# Patient Record
Sex: Female | Born: 1975 | Hispanic: No | Marital: Single | Smoking: Former smoker
Health system: Southern US, Community
[De-identification: ages and names within clinical notes are randomized; demographics above are authoritative.]

## PROBLEM LIST (undated history)

## (undated) DIAGNOSIS — M5430 Sciatica, unspecified side: Secondary | ICD-10-CM

## (undated) DIAGNOSIS — S83519A Sprain of anterior cruciate ligament of unspecified knee, initial encounter: Secondary | ICD-10-CM

## (undated) DIAGNOSIS — E669 Obesity, unspecified: Secondary | ICD-10-CM

## (undated) DIAGNOSIS — Z9889 Other specified postprocedural states: Secondary | ICD-10-CM

## (undated) DIAGNOSIS — D649 Anemia, unspecified: Secondary | ICD-10-CM

## (undated) DIAGNOSIS — R112 Nausea with vomiting, unspecified: Secondary | ICD-10-CM

## (undated) HISTORY — DX: Obesity, unspecified: E66.9

## (undated) HISTORY — DX: Sciatica, unspecified side: M54.30

## (undated) HISTORY — PX: OTHER SURGICAL HISTORY: SHX169

---

## 2008-08-25 ENCOUNTER — Other Ambulatory Visit: Admission: RE | Admit: 2008-08-25 | Discharge: 2008-08-25 | Payer: Self-pay | Admitting: Obstetrics and Gynecology

## 2010-06-21 ENCOUNTER — Other Ambulatory Visit
Admission: RE | Admit: 2010-06-21 | Discharge: 2010-06-21 | Payer: Self-pay | Source: Home / Self Care | Admitting: Obstetrics and Gynecology

## 2013-01-29 ENCOUNTER — Ambulatory Visit: Payer: Self-pay | Admitting: Internal Medicine

## 2013-01-29 VITALS — BP 110/78 | HR 78 | Temp 98.0°F | Resp 16 | Ht 63.5 in | Wt 254.0 lb

## 2013-01-29 DIAGNOSIS — M5431 Sciatica, right side: Secondary | ICD-10-CM

## 2013-01-29 DIAGNOSIS — M25562 Pain in left knee: Secondary | ICD-10-CM

## 2013-01-29 DIAGNOSIS — M5430 Sciatica, unspecified side: Secondary | ICD-10-CM

## 2013-01-29 DIAGNOSIS — Z6841 Body Mass Index (BMI) 40.0 and over, adult: Secondary | ICD-10-CM

## 2013-01-29 DIAGNOSIS — M25569 Pain in unspecified knee: Secondary | ICD-10-CM

## 2013-01-29 MED ORDER — MELOXICAM 15 MG PO TABS: 15.0000 mg | ORAL_TABLET | Freq: Every day | ORAL | Status: DC

## 2013-01-29 MED ORDER — ASPIRIN-CINNAMEDRINE-CAFFEINE 500-15-32 MG PO TABS: 1.0000 | ORAL_TABLET | Freq: Three times a day (TID) | ORAL | Status: DC | PRN

## 2013-01-29 MED ORDER — PREDNISONE 10 MG PO TABS: 20.0000 mg | ORAL_TABLET | Freq: Every day | ORAL | Status: DC

## 2013-01-29 NOTE — Patient Instructions (Signed)
Sciatica °Sciatica is pain, weakness, numbness, or tingling along the path of the sciatic nerve. The nerve starts in the lower back and runs down the back of each leg. The nerve controls the muscles in the lower leg and in the back of the knee, while also providing sensation to the back of the thigh, lower leg, and the sole of your foot. Sciatica is a symptom of another medical condition. For instance, nerve damage or certain conditions, such as a herniated disk or bone spur on the spine, pinch or put pressure on the sciatic nerve. This causes the pain, weakness, or other sensations normally associated with sciatica. Generally, sciatica only affects one side of the body. °CAUSES  °· Herniated or slipped disc. °· Degenerative disk disease. °· A pain disorder involving the narrow muscle in the buttocks (piriformis syndrome). °· Pelvic injury or fracture. °· Pregnancy. °· Tumor (rare). °SYMPTOMS  °Symptoms can vary from mild to very severe. The symptoms usually travel from the low back to the buttocks and down the back of the leg. Symptoms can include: °· Mild tingling or dull aches in the lower back, leg, or hip. °· Numbness in the back of the calf or sole of the foot. °· Burning sensations in the lower back, leg, or hip. °· Sharp pains in the lower back, leg, or hip. °· Leg weakness. °· Severe back pain inhibiting movement. °These symptoms may get worse with coughing, sneezing, laughing, or prolonged sitting or standing. Also, being overweight may worsen symptoms. °DIAGNOSIS  °Your caregiver will perform a physical exam to look for common symptoms of sciatica. He or she may ask you to do certain movements or activities that would trigger sciatic nerve pain. Other tests may be performed to find the cause of the sciatica. These may include: °· Blood tests. °· X-rays. °· Imaging tests, such as an MRI or CT scan. °TREATMENT  °Treatment is directed at the cause of the sciatic pain. Sometimes, treatment is not necessary  and the pain and discomfort goes away on its own. If treatment is needed, your caregiver may suggest: °· Over-the-counter medicines to relieve pain. °· Prescription medicines, such as anti-inflammatory medicine, muscle relaxants, or narcotics. °· Applying heat or ice to the painful area. °· Steroid injections to lessen pain, irritation, and inflammation around the nerve. °· Reducing activity during periods of pain. °· Exercising and stretching to strengthen your abdomen and improve flexibility of your spine. Your caregiver may suggest losing weight if the extra weight makes the back pain worse. °· Physical therapy. °· Surgery to eliminate what is pressing or pinching the nerve, such as a bone spur or part of a herniated disk. °HOME CARE INSTRUCTIONS  °· Only take over-the-counter or prescription medicines for pain or discomfort as directed by your caregiver. °· Apply ice to the affected area for 20 minutes, 3 4 times a day for the first 48 72 hours. Then try heat in the same way. °· Exercise, stretch, or perform your usual activities if these do not aggravate your pain. °· Attend physical therapy sessions as directed by your caregiver. °· Keep all follow-up appointments as directed by your caregiver. °· Do not wear high heels or shoes that do not provide proper support. °· Check your mattress to see if it is too soft. A firm mattress may lessen your pain and discomfort. °SEEK IMMEDIATE MEDICAL CARE IF:  °· You lose control of your bowel or bladder (incontinence). °· You have increasing weakness in the lower back,   pelvis, buttocks, or legs. °· You have redness or swelling of your back. °· You have a burning sensation when you urinate. °· You have pain that gets worse when you lie down or awakens you at night. °· Your pain is worse than you have experienced in the past. °· Your pain is lasting longer than 4 weeks. °· You are suddenly losing weight without reason. °MAKE SURE YOU: °· Understand these  instructions. °· Will watch your condition. °· Will get help right away if you are not doing well or get worse. °Document Released: 06/18/2001 Document Revised: 12/24/2011 Document Reviewed: 11/03/2011 °ExitCare® Patient Information ©2014 [employer]. ° °

## 2013-01-29 NOTE — Progress Notes (Signed)
  Subjective:    Patient ID: Angelica Flores, female    DOB: [birth year], 37 y.o.   MRN: [id number]  HPI 37 YO female patient comes in today with two complaints.   1.  Left knee pain for the past two weeks. She has limited range of motion compared to her right side. States the pain is in her upper knee. She has difficulty with walking down stairs, getting out of vehicles. She has increased her exercise activity and thinks she might have over done it. No prior injury. No swelling. No give way.   2.  She states that she has nerve pain in both her legs from her glut to her knee. This is most consistent in the right leg. She has recurrent problems every 6-12 months with uncertain precipitators. She has tried Aleve and no relief was found. She did get an Rx for a steroid and it resolved. OTC Pamprin and Midol relieves it. The pain comes and last for days. She denies any injury or precursor to the pains. There is no tingling or loss of sensation. Gait is not affected.   Review of Systems Noncontributory    Objective:   Physical Exam BP 110/78  Pulse 78  Temp(Src) 98 F (36.7 C) (Oral)  Resp 16  Ht 5' 3.5" (1.613 m)  Wt 254 lb (115.214 kg)  BMI 44.28 kg/m2  SpO2 100%  LMP 01/15/2013 Obese No acute distress Left knee has no effusion/there is a good range of motion/patellar ballottement normal/full extension and flexion/Lockman's negative/no laxity to stressors/no crepitus/McM neg  Nontender lumbar and buttock area Straight leg raise normal to 90 Slightly tender along the course of the sciatic nerve External rotation of the hip slightly tender in the sciatic notch Deep tendon reflexes symmetrical No sensory losses       Assessment & Plan:  Problem #1 left knee pain suggesting tendinitis Problem #2 right pain suggesting recurrent sciatica  We'll treat with meloxicam and if fails we'll add prednisone in one week to 10 days Okay to continue over-the-counter medicines for left  knee/x-ray if not better soon Rehabilitation exercises given

## 2013-03-05 ENCOUNTER — Other Ambulatory Visit: Payer: Self-pay | Admitting: Internal Medicine

## 2013-05-12 ENCOUNTER — Telehealth: Payer: Self-pay

## 2013-05-12 NOTE — Telephone Encounter (Signed)
Pt sent over on the fax yesterday a request to go [facility] and was checking to make sure that this was receieved and nothing has been put into referrals she would like an  email at [email or web address] once all has been completed.  Best number [phone]

## 2013-05-12 NOTE — Telephone Encounter (Signed)
Can you refer? You have only seen her once for knee pain. Her BMI is greater than 40

## 2013-05-12 NOTE — Telephone Encounter (Signed)
Yes Patient Active Problem List   Diagnosis Date Noted  . BMI 40.0-44.9, adult 01/29/2013   By all means refer for weight management

## 2013-05-12 NOTE — Telephone Encounter (Signed)
Done patient advised.

## 2013-07-12 ENCOUNTER — Telehealth: Payer: Self-pay

## 2013-07-12 NOTE — Telephone Encounter (Signed)
Pt has designated dr Merla Richesdoolittle has her pcp, pt wants to talk with the doctor about having gastric bypass surgery

## 2013-07-13 ENCOUNTER — Telehealth: Payer: Self-pay

## 2013-07-13 NOTE — Telephone Encounter (Signed)
Patient indicates she is listing you as her primary care with her insurance. You have seen her once for acute knee pain, how do you want to handle this? She is interested in gastric bypass surgery, will need letters of medical necessity and medical clearance.

## 2013-07-13 NOTE — Telephone Encounter (Signed)
Angelica Flores:  Patient wants to see you to discuss and get cleared for bariatric surgery / vsg (vertical sleeve) , she was given your name as a recommendation, she wants you to know she is 5'31/1102" tall, 38 years old and weighs 285 lbs.  Please call her to discuss prior to her making an appt.  Best numbers to call (between 7a.m. - 3:30- [phone] work), cell [phone] after 3:30.

## 2013-07-13 NOTE — Telephone Encounter (Signed)
Patient should come in to discuss this. I have advised her to make appointment.

## 2013-07-13 NOTE — Telephone Encounter (Signed)
She sent me in the mail with this request as well/please tell her that I plan to retire in 2016-it would be best if we scheduled her to see Benny LennertSarah Flores on a Wednesday when we are both at 104 since my appointments are backed up

## 2013-07-13 NOTE — Telephone Encounter (Signed)
Thank you. I have called her to advise. She was transferred to make an appointment with Maralyn SagoSarah.

## 2013-07-23 ENCOUNTER — Other Ambulatory Visit (HOSPITAL_COMMUNITY)
Admission: RE | Admit: 2013-07-23 | Discharge: 2013-07-23 | Disposition: A | Discharge status: Home / Self Care | Payer: 59 | Source: Ambulatory Visit | Attending: Obstetrics and Gynecology | Admitting: Obstetrics and Gynecology

## 2013-07-23 ENCOUNTER — Other Ambulatory Visit: Payer: Self-pay | Admitting: Obstetrics and Gynecology

## 2013-07-23 DIAGNOSIS — Z1151 Encounter for screening for human papillomavirus (HPV): Secondary | ICD-10-CM | POA: Insufficient documentation

## 2013-07-23 DIAGNOSIS — Z01419 Encounter for gynecological examination (general) (routine) without abnormal findings: Secondary | ICD-10-CM | POA: Insufficient documentation

## 2013-07-28 ENCOUNTER — Ambulatory Visit: Payer: Self-pay | Admitting: *Deleted

## 2013-08-05 ENCOUNTER — Ambulatory Visit: Payer: Self-pay | Admitting: *Deleted

## 2013-08-12 ENCOUNTER — Ambulatory Visit (INDEPENDENT_AMBULATORY_CARE_PROVIDER_SITE_OTHER): Payer: 59 | Admitting: Surgery

## 2013-08-12 ENCOUNTER — Encounter (INDEPENDENT_AMBULATORY_CARE_PROVIDER_SITE_OTHER): Payer: Self-pay | Admitting: Surgery

## 2013-08-12 ENCOUNTER — Other Ambulatory Visit (INDEPENDENT_AMBULATORY_CARE_PROVIDER_SITE_OTHER): Payer: Self-pay

## 2013-08-12 NOTE — Progress Notes (Addendum)
Re:   Angelica Flores DOB:   [birth year] MRN:   [id number]  ASSESSMENT AND PLAN: 1.  Morbid obesity  Weight - 282, BMI - 49.27  Per the 1991 NIH Consensus Statement, the patient is a candidate for bariatric surgery.  The patient attended our initial information session and reviewed the types of bariatric surgery.    The patient is interested in the sleeve gastrectomy.  I discussed with the patient the indications and risks of bariatric surgery.  The potential risks of surgery include, but are not limited to, bleeding, infection, leak from the bowel, DVT and PE, open surgery, long term nutrition consequences, and death.  I also discussed that I have not done a sleeve gastrectomy yet, but have assisted on more that 10.  I have been doing bariatric surgery since 2003.  Our group just started doing sleeve gastrectomies in the last 2 years.  The patient understands the importance of compliance and long term follow-up with our group after surgery.  From here we will obtain lab tests, x-rays, nutrition consult, and psych consult. [US abdomen - has gall stones.  DN 09/06/2013]  2.  Knee and thigh pain 3.  Scoliosis and disk disease  Chief Complaint  Patient presents with  . Bariatric Pre-op   REFERRING PHYSICIAN: Gaye AlkenBARNES,ELIZABETH STEWART, MD  HISTORY OF PRESENT ILLNESS: Angelica Flores is a 38 y.o. (DOB: [birth year])  AA  female whose primary care physician is Gaye AlkenBARNES,ELIZABETH STEWART, MD and comes to me today for consideration of weight loss surgery.  The patient first started dieting at age 38. She cut back on sodas, breads, and  sugars and lost 20-30 pounds. She did the same thing when she was age 38. She has tried multiple diets including: HCG, Atkins, [place] diet. She has been to [place] in [place] help in beauty. She tried Dr. Jodi MourningGracios's medicine - Vermox and ?, which worked for a while.  In 2011, she got phentermine through Dr[facility] Theodoro KosGirouard [facility].  She seems to be able  to do a diet for a while to loose 20 or 30 pounds, then she gains it back.  She heard Dr. Andrey CampanileWilson at the information session.  She knows someone who had a RYGB, but not a sleeve gastrectomy.  I mentioned the support group.  She has no history of stomach disease, liver disease,  gall bladder disease, pancreas disease, or colon disease.   No past medical history on file.   No past surgical history on file.    Current Outpatient Prescriptions  Medication Sig Dispense Refill  . Aspirin-Cinnamedrine-Caffeine (MIDOL MAXIMUM STRENGTH) 500-15-32 MG TABS Take 1 tablet by mouth 3 (three) times daily as needed.  90 each  5  . Prenatal Vit-Fe Sulfate-FA (PRENATAL VITAMIN PO) Take by mouth daily.      . meloxicam (MOBIC) 15 MG tablet Take 1 tablet (15 mg total) by mouth daily.  30 tablet  0  . predniSONE (DELTASONE) 10 MG tablet Take 2 tablets (20 mg total) by mouth daily. 3/3/3/2/2/1/1 SIngle daily dose 6 days  12 tablet  0   No current facility-administered medications for this visit.     No Known Allergies  REVIEW OF SYSTEMS: Skin:  No history of rash.  No history of abnormal moles. Infection:  No history of hepatitis or HIV.  No history of MRSA. Neurologic:  No history of stroke.  No history of seizure.  No history of headaches. Cardiac:  No history of hypertension. No  history of heart disease.  No history of prior cardiac catheterization.  No history of seeing a cardiologist. Pulmonary:  Does not smoke cigarettes.  No asthma or bronchitis.  No OSA/CPAP - she was tested in 2006 and was negative.  Epworth score is 6.  Endocrine:  No diabetes. No thyroid disease. Gastrointestinal:  See HPI Urologic:  No history of kidney stones.  No history of bladder infections. Musculoskeletal:  Has scoliosis/disk disease - 2011.  She says that she has sciatica.  She has thigh and knee pain.  Midol and Prednisone (short course) have helped.  Her last Prednisone was last July for one month.  She has seen Dr. Wandra Feinstein for this. Hematologic:  No bleeding disorder.  No history of anemia.  Not anticoagulated. Psycho-social:  The patient is oriented.   The patient has no obvious psychologic or social impairment to understanding our conversation and plan.  SOCIAL and FAMILY HISTORY: Single. 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  She gets her labs free at [facility]. She has one sone - 36 yo.  PHYSICAL EXAM: BP 120/70  Pulse 80  Resp 16  Ht 5' 3.5" (1.613 m)  Wt 282 lb 9.6 oz (128.187 kg)  BMI 49.27 kg/m2  General: Obese AA F who is alert and generally healthy appearing.  HEENT: Normal. Pupils equal. Neck: Supple. No mass.  No thyroid mass. Lymph Nodes:  No supraclavicular or cervical nodes. Lungs: Clear to auscultation and symmetric breath sounds. Heart:  RRR. No murmur or rub. Abdomen: Soft. No mass. No tenderness. No hernia. Normal bowel sounds.  No abdominal scars. Rectal: Not done. Extremities:  Good strength and ROM  in upper and lower extremities. Neurologic:  Grossly intact to motor and sensory function. Psychiatric: Has normal mood and affect. Behavior is normal.   DATA REVIEWED: Notes that she brought with her and Epic notes.  Ovidio Kin, MD,  [facility] [address]  [address]   [place], [place]    [address] Phone:  [phone] FAX:  [phone]

## 2013-08-24 ENCOUNTER — Ambulatory Visit: Payer: Self-pay | Admitting: Dietician

## 2013-08-25 ENCOUNTER — Encounter: Payer: Self-pay | Admitting: Dietician

## 2013-08-25 ENCOUNTER — Encounter: Payer: 59 | Attending: Surgery | Admitting: Dietician

## 2013-08-25 ENCOUNTER — Ambulatory Visit: Payer: Self-pay | Admitting: Physician Assistant

## 2013-08-25 DIAGNOSIS — E669 Obesity, unspecified: Secondary | ICD-10-CM | POA: Insufficient documentation

## 2013-08-25 DIAGNOSIS — Z713 Dietary counseling and surveillance: Secondary | ICD-10-CM | POA: Insufficient documentation

## 2013-08-25 DIAGNOSIS — Z01818 Encounter for other preprocedural examination: Secondary | ICD-10-CM | POA: Insufficient documentation

## 2013-08-25 NOTE — Patient Instructions (Signed)
Start working on [employer] CompaniesPre-Op Goals. Look for Protein Shakes that you like.

## 2013-08-25 NOTE — Progress Notes (Signed)
  Pre-Op Assessment Visit:  Pre-Operative Sleeve Gastrectomy Surgery  Medical Nutrition Therapy:  Appt start time: 1115   End time:  1200.  Patient was seen on 08/25/2013 for Pre-Operative Sleeve Gastrectomy Nutrition Assessment. Assessment and [facility] on 08/25/2013.   Preferred Learning Style:   No preference indicated   Learning Readiness:   Ready  Handouts given during visit include:  Pre-Op Goals Bariatric Surgery Protein Shakes  Teaching Method Utilized:  Visual Auditory Hands on  Barriers to learning/adherence to lifestyle change: none  Demonstrated degree of understanding via:  Teach Back   Patient to [facility] to enroll in Pre-Op and Post-Op Nutrition Education when surgery date is scheduled.

## 2013-09-02 ENCOUNTER — Encounter (HOSPITAL_COMMUNITY)
Admission: RE | Disposition: A | Discharge status: Home / Self Care | Payer: Self-pay | Source: Ambulatory Visit | Attending: Surgery

## 2013-09-02 ENCOUNTER — Ambulatory Visit (HOSPITAL_COMMUNITY)
Admission: RE | Admit: 2013-09-02 | Discharge: 2013-09-02 | Disposition: A | Discharge status: Home / Self Care | Payer: 59 | Source: Ambulatory Visit | Attending: Surgery | Admitting: Surgery

## 2013-09-02 HISTORY — PX: BREATH TEK H PYLORI: SHX5422

## 2013-09-02 SURGERY — BREATH TEST, FOR HELICOBACTER PYLORI

## 2013-09-03 ENCOUNTER — Encounter (HOSPITAL_COMMUNITY): Payer: Self-pay | Admitting: Surgery

## 2013-09-03 ENCOUNTER — Ambulatory Visit (HOSPITAL_COMMUNITY)
Admission: RE | Admit: 2013-09-03 | Discharge: 2013-09-03 | Disposition: A | Discharge status: Home / Self Care | Payer: 59 | Source: Ambulatory Visit | Attending: Surgery | Admitting: Surgery

## 2013-09-03 ENCOUNTER — Other Ambulatory Visit: Payer: Self-pay

## 2013-09-03 DIAGNOSIS — M25569 Pain in unspecified knee: Secondary | ICD-10-CM | POA: Insufficient documentation

## 2013-09-03 DIAGNOSIS — Z6841 Body Mass Index (BMI) 40.0 and over, adult: Secondary | ICD-10-CM | POA: Insufficient documentation

## 2013-09-03 DIAGNOSIS — K8020 Calculus of gallbladder without cholecystitis without obstruction: Secondary | ICD-10-CM | POA: Insufficient documentation

## 2013-09-03 DIAGNOSIS — M79609 Pain in unspecified limb: Secondary | ICD-10-CM | POA: Insufficient documentation

## 2013-09-03 DIAGNOSIS — M4120 Other idiopathic scoliosis, site unspecified: Secondary | ICD-10-CM | POA: Insufficient documentation

## 2013-09-23 ENCOUNTER — Encounter: Payer: 59 | Attending: Surgery | Admitting: Dietician

## 2013-09-23 DIAGNOSIS — Z01818 Encounter for other preprocedural examination: Secondary | ICD-10-CM | POA: Insufficient documentation

## 2013-09-23 DIAGNOSIS — Z713 Dietary counseling and surveillance: Secondary | ICD-10-CM | POA: Insufficient documentation

## 2013-09-23 DIAGNOSIS — E669 Obesity, unspecified: Secondary | ICD-10-CM | POA: Insufficient documentation

## 2013-09-23 NOTE — Patient Instructions (Signed)
-  Maintain high vegetable intake -Follow MyPlate guidelines -Increase physical activity

## 2013-09-23 NOTE — Progress Notes (Signed)
  Medical Nutrition Therapy:  Appt start time: 1500 end time:  1515.  Assessment:  Primary concerns today: SWL visit #1 out of 6. Trying to increase vegetable intake by making shakes with kale. Increased veggie intake by 2 servings overall. Working a lot lately and not preparing food ahead of time like she wants to. Has supportive family and co-worker/lunch buddy is supportive and has similar weight loss goals.   MEDICATIONS: prenatal vitamins  Recent physical activity: none lately; plans to start Zumba with DVDs at home  Estimated energy needs: 1800-2000 calories   Progress Towards Goal(s):  In progress.   Nutritional Diagnosis:  [place]-3.3 Overweight/obesity related to poor dietary habits and physical inactivity as evidenced by BMI 51.     Intervention:  Nutrition counseling provided.  Handouts given during visit include:  MyPlate  Monitoring/Evaluation:  Dietary intake, exercise, and body weight in 4 week(s).

## 2013-10-14 ENCOUNTER — Other Ambulatory Visit (INDEPENDENT_AMBULATORY_CARE_PROVIDER_SITE_OTHER): Payer: Self-pay

## 2013-10-25 ENCOUNTER — Encounter: Payer: 59 | Attending: Surgery | Admitting: Dietician

## 2013-10-25 DIAGNOSIS — E669 Obesity, unspecified: Secondary | ICD-10-CM | POA: Insufficient documentation

## 2013-10-25 DIAGNOSIS — Z713 Dietary counseling and surveillance: Secondary | ICD-10-CM | POA: Insufficient documentation

## 2013-10-25 DIAGNOSIS — Z01818 Encounter for other preprocedural examination: Secondary | ICD-10-CM | POA: Insufficient documentation

## 2013-10-25 NOTE — Progress Notes (Signed)
  Medical Nutrition Therapy:  Appt start time: 1430 end time:  1445.  Assessment:  Primary concerns today: Angelica Flores is here for her SWL visit #2 out of 6. She has lost 11 pounds since her last visit. She has been increasing her vegetable intake by making fruit shakes with kale or spinach and sipping them all day. She has been better about preparing food ahead of time like she wants to. Has supportive family and co-worker/lunch buddy is supportive and has similar weight loss goals.   MEDICATIONS: prenatal vitamins  Recent physical activity: walking 3 miles 2 days per week  Estimated energy needs: 1800-2000 calories   Progress Towards Goal(s):  In progress.   Nutritional Diagnosis:  [place]-3.3 Overweight/obesity related to poor dietary habits and physical inactivity as evidenced by BMI 49.     Intervention:  Nutrition counseling provided.  Handouts given during visit include:  MyPlate  Monitoring/Evaluation:  Dietary intake, exercise, and body weight in 4 week(s).

## 2013-10-25 NOTE — Patient Instructions (Addendum)
-  Maintain high vegetable intake -Follow MyPlate guidelines -Increase physical activity as tolerated

## 2013-11-25 ENCOUNTER — Encounter: Payer: 59 | Attending: Surgery | Admitting: Dietician

## 2013-11-25 DIAGNOSIS — E669 Obesity, unspecified: Secondary | ICD-10-CM | POA: Insufficient documentation

## 2013-11-25 DIAGNOSIS — Z713 Dietary counseling and surveillance: Secondary | ICD-10-CM | POA: Insufficient documentation

## 2013-11-25 DIAGNOSIS — Z01818 Encounter for other preprocedural examination: Secondary | ICD-10-CM | POA: Insufficient documentation

## 2013-11-25 NOTE — Patient Instructions (Addendum)
*  Maintain current goals!  -Maintain high vegetable intake -Follow MyPlate guidelines -Increase physical activity as tolerated

## 2013-11-25 NOTE — Progress Notes (Signed)
  Medical Nutrition Therapy:  Appt start time: 1700 end time:  1715.  Assessment:  Primary concerns today:  SWL visit #3. Angelica Flores has lost 8 pounds since last visit. Increased exercise, more during the week. Walking 2.5-3 miles at a time (6 miles on the weekends). "Splurges feel different now." She has been increasing her vegetable intake by making fruit shakes with kale or spinach and sipping them all day. She has been better about preparing food ahead of time like she wants to. Has supportive family and co-worker/lunch buddy is supportive and has similar weight loss goals.   MEDICATIONS: prenatal vitamins  Recent physical activity: walking 2.5-3 miles 2-3 days per week  Estimated energy needs: 1800-2000 calories   Progress Towards Goal(s):  In progress.   Nutritional Diagnosis:  [place]-3.3 Overweight/obesity related to poor dietary habits and physical inactivity as evidenced by BMI 49.     Intervention:  Nutrition counseling provided.   Monitoring/Evaluation:  Dietary intake, exercise, and body weight in 4 week(s).

## 2013-12-23 ENCOUNTER — Encounter: Payer: 59 | Attending: Surgery | Admitting: Dietician

## 2013-12-23 DIAGNOSIS — Z713 Dietary counseling and surveillance: Secondary | ICD-10-CM | POA: Insufficient documentation

## 2013-12-23 DIAGNOSIS — Z01818 Encounter for other preprocedural examination: Secondary | ICD-10-CM | POA: Insufficient documentation

## 2013-12-23 DIAGNOSIS — E669 Obesity, unspecified: Secondary | ICD-10-CM | POA: Insufficient documentation

## 2013-12-23 NOTE — Patient Instructions (Addendum)
-  Increase vegetable intake -Follow MyPlate guidelines -Increase water intake  -Increase physical activity as tolerated

## 2013-12-23 NOTE — Progress Notes (Signed)
  Medical Nutrition Therapy:  Appt start time: 415 end time:  430.  Follow-up:  Primary concerns today:  SWL visit #4.   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 She reports not walking as much because of the heat. She is also not having as many vegetables. She states that her goal is to lose 10 pounds by her next visit. We discussed a healthy weight loss rate of 1-2 pounds per week. Although the patient is scheduled to be approved for her surgery at the end of the summer, she plans to have surgery in winter due to finances.   MEDICATIONS: prenatal vitamins  Recent physical activity: no walking in past week or 2  Estimated energy needs: 1800-2000 calories   Progress Towards Goal(s):  In progress.   Nutritional Diagnosis:  [place]-3.3 Overweight/obesity related to poor dietary habits and physical inactivity as evidenced by BMI 49.     Intervention:  Nutrition counseling provided.   Monitoring/Evaluation:  Dietary intake, exercise, and body weight in 4 week(s).

## 2014-01-24 ENCOUNTER — Encounter: Payer: 59 | Attending: Surgery | Admitting: Dietician

## 2014-01-24 DIAGNOSIS — Z01818 Encounter for other preprocedural examination: Secondary | ICD-10-CM | POA: Diagnosis present

## 2014-01-24 DIAGNOSIS — Z713 Dietary counseling and surveillance: Secondary | ICD-10-CM | POA: Diagnosis not present

## 2014-01-24 DIAGNOSIS — E669 Obesity, unspecified: Secondary | ICD-10-CM | POA: Diagnosis not present

## 2014-01-24 NOTE — Progress Notes (Signed)
  Medical Nutrition Therapy:  Appt start time: 515 end time:  530.  Follow-up:  Primary concerns today:  SWL visit #5.   Angelica Flores returns today having lost 8 pounds since last visit. She reports that she has started exercising more: walking 1-2x a week. She reports that she hopes to lose another 8 pounds before next SWL visit. Angelica Flores states that she has increased vegetable intake. Work schedule has been hectic. Has increased water intake but states that she still "has a long way to go." Working on chewing and portion sizes in preparation for gastric sleeve.   MEDICATIONS: prenatal vitamins  Recent physical activity: walking 1-2x a week  Estimated energy needs: 1800-2000 calories   Progress Towards Goal(s):  In progress.   Nutritional Diagnosis:  [place]-3.3 Overweight/obesity related to poor dietary habits and physical inactivity as evidenced by BMI 49.     Intervention:  Nutrition counseling provided.   Monitoring/Evaluation:  Dietary intake, exercise, and body weight in 4 week(s).

## 2014-01-24 NOTE — Patient Instructions (Addendum)
-  Increase water intake -Increase physical activity as tolerated

## 2014-02-24 ENCOUNTER — Ambulatory Visit: Payer: 59 | Admitting: Dietician

## 2014-03-07 ENCOUNTER — Encounter: Payer: 59 | Attending: Surgery | Admitting: Dietician

## 2014-03-07 DIAGNOSIS — Z01818 Encounter for other preprocedural examination: Secondary | ICD-10-CM | POA: Diagnosis not present

## 2014-03-07 DIAGNOSIS — E669 Obesity, unspecified: Secondary | ICD-10-CM | POA: Diagnosis not present

## 2014-03-07 DIAGNOSIS — Z713 Dietary counseling and surveillance: Secondary | ICD-10-CM | POA: Insufficient documentation

## 2014-03-07 NOTE — Patient Instructions (Signed)
-  Increase physical activity as tolerated -Drink mostly water or other zero calorie beverage. -If you want some juice, add more vegetables and have 1-2 serving of fruit (1-2 cups). -Keep working on Pre-Op goals - chewing well, not drinking while you are eating. -Try some protein shakes. -Talk to [place] about scheduling surgery.  Once surgery is scheduled, contact [facility] to enroll in Pre-Op Class.

## 2014-03-07 NOTE — Progress Notes (Signed)
  Medical Nutrition Therapy:  Appt start time: 345 end time:  400.  Follow-up:  Primary concerns today:  SWL visit #6.   Angelica Flores returns today having gained about 7 lbs since last visit. Has not been able to do her exercise as much as before. Has been working more than before. Overall lost about 19 lbs from 09/2013 until today.    Hasn't made too many changes to her diet those has had some burgers in the past month. Still working on chewing well. Drinking water and has 64 oz fruit juice per day (whole pineapple and 6 apples) sometimes with vegetables.   MEDICATIONS: prenatal vitamins  Recent physical activity: walking 1-2x a week  Estimated energy needs: 1800-2000 calories   Progress Towards Goal(s):  In progress.   Nutritional Diagnosis:  [place]-3.3 Overweight/obesity related to poor dietary habits and physical inactivity as evidenced by BMI 49.     Intervention:  Nutrition counseling provided.   Monitoring/Evaluation:  Dietary intake, exercise, and body weight and return for Pre-Op Class.

## 2014-06-20 ENCOUNTER — Encounter: Payer: 59 | Attending: Surgery

## 2014-06-20 DIAGNOSIS — Z713 Dietary counseling and surveillance: Secondary | ICD-10-CM | POA: Diagnosis not present

## 2014-06-20 DIAGNOSIS — Z6841 Body Mass Index (BMI) 40.0 and over, adult: Secondary | ICD-10-CM | POA: Insufficient documentation

## 2014-06-21 NOTE — Progress Notes (Signed)
  Pre-Operative Nutrition Class:  Appt start time: 1898   End time:  1830.  Patient was seen on 06/20/14 for Pre-Operative Bariatric Surgery Education at the [facility].   Surgery date: 07/05/14 Surgery type: gastric sleeve Start weight at [facility]: 286 lbs on 08/25/13 Weight today: 274.5 lbs  TANITA  BODY COMP RESULTS  06/20/14   BMI (kg/m^2) 47.9   Fat Mass (lbs) 141.5   Fat Free Mass (lbs) 133   Total Body Water (lbs) 97.5   Samples given per MNT protocol. Patient educated on appropriate usage: Premier protein shake (chocolate - qty 1) Lot #: [id number] Exp: 03/2015  The following the learning objectives were met by the patient during this course:  Identify Pre-Op Dietary Goals and will begin 2 weeks pre-operatively  Identify appropriate sources of fluids and proteins   State protein recommendations and appropriate sources pre and post-operatively  Identify Post-Operative Dietary Goals and will follow for 2 weeks post-operatively  Identify appropriate multivitamin and calcium sources  Describe the need for physical activity post-operatively and will follow MD recommendations  State when to call healthcare provider regarding medication questions or post-operative complications  Handouts given during class include:  Pre-Op Bariatric Surgery Diet Handout  Protein Shake Handout  Post-Op Bariatric Surgery Nutrition Handout  BELT Program Information Flyer  Support Group Information Flyer  [facility] Bariatric Supplements Price List  Follow-Up Plan: Patient will follow-up at [facility] 2 weeks post operatively for diet advancement per MD.

## 2014-06-23 ENCOUNTER — Ambulatory Visit (INDEPENDENT_AMBULATORY_CARE_PROVIDER_SITE_OTHER): Payer: Self-pay | Admitting: Surgery

## 2014-06-23 NOTE — H&P (Signed)
Chief Complaint:  Morbid obesity BMI 47.6 and a weight of 274.  History of Present Illness:  Angelica Flores is an 38 y.o. female initially seen earlier this year by Dr. Ezzard StandingNewman and she is a [employer] healthcare patient who has qualified for laparoscopic sleeve gastrectomy.  I have seen and examined her preoperatively and have answered all of her questions regarding sleeve gastrectomy.  She has done a thorough job researching this including watching online videos of the procedure itself and I described how I do the procedure in detail.  She is aware of the risk of bleeding and leak.  Her mother accompanies her on this visit and appears to be a very supportive of her decision to undergo sleeve gastrectomy.  Her past medical history is remarkable in that she did have some ureteral lengthening procedure as a child at the [facility] when she was 38 years old.  She said the only allergic problem she had with penicillin which she got sick and vomited.  I indicated this may not be a true allergies.  She says she's had problem with her left knee and may have a problem with her cruciate ligaments.  She denies any history of DVT.  We'll proceed with laparoscopic sleeve gastrectomy.  Past Medical History  Diagnosis Date  . Obesity   . Sciatic pain     Past Surgical History  Procedure Laterality Date  . Vur    . Breath tek h pylori N/A 09/02/2013    Procedure: BREATH TEK H PYLORI;  Surgeon: Kandis Cockingavid H Newman, MD;  [facility];  Service: General;  Laterality: N/A;    Current Outpatient Prescriptions  Medication Sig Dispense Refill  . Aspirin-Cinnamedrine-Caffeine (MIDOL MAXIMUM STRENGTH) 500-15-32 MG TABS Take 1 tablet by mouth 3 (three) times daily as needed. 90 each 5  . meloxicam (MOBIC) 15 MG tablet Take 1 tablet (15 mg total) by mouth daily. 30 tablet 0  . MONONESSA 0.25-35 MG-MCG tablet     . Prenatal Vit-Fe Sulfate-FA (PRENATAL VITAMIN PO) Take by mouth daily.     No current  facility-administered medications for this visit.   Review of patient's allergies indicates no known allergies. Family History  Problem Relation Age of Onset  . Hypertension Other   . Obesity Other    Social History:   reports that she has been smoking Cigarettes.  She has been smoking about 0.50 packs per day. She does not have any smokeless tobacco history on file. She reports that she does not drink alcohol or use illicit drugs.   REVIEW OF SYSTEMS : Negative except for prior urologic surgery as a child.  Physical Exam:   There were no vitals taken for this visit. There is no weight on file to calculate BMI.  Gen:  WDWN African-American female NAD  Neurological: Alert and oriented to person, place, and time. Motor and sensory function is grossly intact  Head: Normocephalic and atraumatic.  Eyes: Conjunctivae are normal. Pupils are equal, round, and reactive to light. No scleral icterus.  Neck: Normal range of motion. Neck supple. No tracheal deviation or thyromegaly present.  Cardiovascular:  SR without murmurs or gallops.  No carotid bruits Breast:  Not examined Respiratory: Effort normal.  No respiratory distress. No chest wall tenderness. Breath sounds normal.  No wheezes, rales or rhonchi.  Abdomen:  Obese nontender GU:  Not examined  Musculoskeletal: Normal range of motion. Extremities are nontender. No cyanosis, edema or clubbing noted Lymphadenopathy: No cervical, preauricular, postauricular or  axillary adenopathy is present Skin: Skin is warm and dry. No rash noted. No diaphoresis. No erythema. No pallor. Pscyh: Normal mood and affect. Behavior is normal. Judgment and thought content normal.   LABORATORY RESULTS: No results found for this or any previous visit (from the past 48 hour(s)).   RADIOLOGY RESULTS: No results found.  Problem List: Patient Active Problem List   Diagnosis Date Noted  . Morbid obesity 08/12/2013    Assessment & Plan: Morbid obesity and  desire for sleeve gastrectomy.  Plan December 29 sleeve gastrectomy.    Matt B. Ellery Tash, MD, FACS  [facility] [phone] beeper [phone]  06/23/2014 11:58 AM      

## 2014-06-27 NOTE — Patient Instructions (Addendum)
Angelica Flores  06/27/2014   Your procedure is scheduled on: 07/05/2014                Come either thru Emergency Room Entrance or Main entrance at 0515am and [facility].               Call this number if you have problems the morning of surgery [phone]   Remember:  Do not eat food or drink liquids :After Midnight.     Take these medicines the morning of surgery with A SIP OF WATER: none                               You may not have any metal on your body including hair pins and              piercings  Do not wear jewelry, make-up, lotions, powders or perfumes.             Do not wear nail polish.  Do not shave  48 hours prior to surgery.           Do not bring valuables to the hospital. [facility] IS NOT             RESPONSIBLE   FOR VALUABLES.  Contacts, dentures or bridgework may not be worn into surgery.  Leave suitcase in the car. After surgery it may be brought to your room.       Special Instructions: coughing and deep breathing exercises, leg exercises              Please read over the following fact sheets you were given: _____________________________________________________________________             [facility] - Preparing for [facility] Before surgery, you can play an important role.  Because skin is not sterile, your skin needs to be as free of germs as possible.  You can reduce the number of germs on your skin by washing with CHG (chlorahexidine gluconate) soap before surgery.  CHG is an antiseptic cleaner which kills germs and bonds with the skin to continue killing germs even after washing. Please DO NOT use if you have an allergy to CHG or antibacterial soaps.  If your skin becomes reddened/irritated stop using the CHG and inform your nurse when you arrive at Short Stay. Do not shave (including legs and underarms) for at least 48 hours prior to the first CHG shower.  You may shave your face/neck. Please follow  these instructions carefully:  1.  Shower with CHG Soap the night before surgery and the  morning of Surgery.  2.  If you choose to wash your hair, wash your hair first as usual with your  normal  shampoo.  3.  After you shampoo, rinse your hair and body thoroughly to remove the  shampoo.                           4.  Use CHG as you would any other liquid soap.  You can apply chg directly  to the skin and wash                       Gently with a scrungie or clean washcloth.  5.  Apply the CHG  Soap to your body ONLY FROM THE NECK DOWN.   Do not use on face/ open                           Wound or open sores. Avoid contact with eyes, ears mouth and genitals (private parts).                       Wash face,  Genitals (private parts) with your normal soap.             6.  Wash thoroughly, paying special attention to the area where your surgery  will be performed.  7.  Thoroughly rinse your body with warm water from the neck down.  8.  DO NOT shower/wash with your normal soap after using and rinsing off  the CHG Soap.                9.  Pat yourself dry with a clean towel.            10.  Wear clean pajamas.            11.  Place clean sheets on your bed the night of your first shower and do not  sleep with pets. Day of Surgery : Do not apply any lotions/deodorants the morning of surgery.  Please wear clean clothes to the hospital/surgery center.  FAILURE TO FOLLOW THESE INSTRUCTIONS MAY RESULT IN THE CANCELLATION OF YOUR SURGERY PATIENT SIGNATURE_________________________________  NURSE SIGNATURE__________________________________  ________________________________________________________________________

## 2014-06-29 ENCOUNTER — Encounter (HOSPITAL_COMMUNITY)
Admission: RE | Admit: 2014-06-29 | Discharge: 2014-06-29 | Disposition: A | Discharge status: Home / Self Care | Payer: 59 | Source: Ambulatory Visit | Attending: Surgery | Admitting: Surgery

## 2014-06-29 ENCOUNTER — Encounter (HOSPITAL_COMMUNITY): Payer: Self-pay

## 2014-06-29 DIAGNOSIS — Z01812 Encounter for preprocedural laboratory examination: Secondary | ICD-10-CM | POA: Diagnosis present

## 2014-06-29 HISTORY — DX: Sprain of anterior cruciate ligament of unspecified knee, initial encounter: S83.519A

## 2014-06-29 HISTORY — DX: Other specified postprocedural states: Z98.890

## 2014-06-29 HISTORY — DX: Anemia, unspecified: D64.9

## 2014-06-29 HISTORY — DX: Nausea with vomiting, unspecified: R11.2

## 2014-06-29 LAB — COMPREHENSIVE METABOLIC PANEL
ALBUMIN: 3.5 g/dL (ref 3.5–5.2)
ALT: 17 U/L (ref 0–35)
AST: 19 U/L (ref 0–37)
Alkaline Phosphatase: 103 U/L (ref 39–117)
Anion gap: 7 (ref 5–15)
BUN: 16 mg/dL (ref 6–23)
CO2: 27 mmol/L (ref 19–32)
Calcium: 8.9 mg/dL (ref 8.4–10.5)
Chloride: 102 mEq/L (ref 96–112)
Creatinine, Ser: 0.68 mg/dL (ref 0.50–1.10)
GFR calc Af Amer: >90 mL/min (ref >90)
GFR calc non Af Amer: >90 mL/min (ref >90)
Glucose, Bld: 86 mg/dL (ref 70–99)
POTASSIUM: 4.1 mmol/L (ref 3.5–5.1)
Sodium: 136 mmol/L (ref 135–145)
Total Bilirubin: 0.5 mg/dL (ref 0.3–1.2)
Total Protein: 7.5 g/dL (ref 6.0–8.3)

## 2014-06-29 LAB — CBC WITH DIFFERENTIAL/PLATELET
BASOS PCT: 0 % (ref 0–1)
Basophils Absolute: 0 10*3/uL (ref 0.0–0.1)
Eosinophils Absolute: 0.4 10*3/uL (ref 0.0–0.7)
Eosinophils Relative: 6 % — ABNORMAL HIGH (ref 0–5)
HCT: 36.6 % (ref 36.0–46.0)
HEMOGLOBIN: 11.8 g/dL — AB (ref 12.0–15.0)
LYMPHS PCT: 37 % (ref 12–46)
Lymphs Abs: 2.6 10*3/uL (ref 0.7–4.0)
MCH: 25 pg — ABNORMAL LOW (ref 26.0–34.0)
MCHC: 32.2 g/dL (ref 30.0–36.0)
MCV: 77.5 fL — ABNORMAL LOW (ref 78.0–100.0)
MONOS PCT: 9 % (ref 3–12)
Monocytes Absolute: 0.6 10*3/uL (ref 0.1–1.0)
NEUTROS ABS: 3.4 10*3/uL (ref 1.7–7.7)
NEUTROS PCT: 48 % (ref 43–77)
Platelets: 264 10*3/uL (ref 150–400)
RBC: 4.72 MIL/uL (ref 3.87–5.11)
RDW: 15.8 % — ABNORMAL HIGH (ref 11.5–15.5)
WBC: 7.1 10*3/uL (ref 4.0–10.5)

## 2014-07-05 ENCOUNTER — Encounter (HOSPITAL_COMMUNITY): Payer: Self-pay | Admitting: *Deleted

## 2014-07-05 ENCOUNTER — Encounter (HOSPITAL_COMMUNITY)
Admission: RE | Disposition: A | Discharge status: Home / Self Care | Payer: Self-pay | Source: Ambulatory Visit | Attending: Surgery

## 2014-07-05 ENCOUNTER — Ambulatory Visit (HOSPITAL_COMMUNITY): Payer: 59 | Admitting: Anesthesiology

## 2014-07-05 ENCOUNTER — Inpatient Hospital Stay (HOSPITAL_COMMUNITY)
Admission: RE | Admit: 2014-07-05 | Discharge: 2014-07-07 | DRG: 621 | Disposition: A | Discharge status: Home / Self Care | Payer: 59 | Source: Ambulatory Visit | Attending: Surgery | Admitting: Surgery

## 2014-07-05 DIAGNOSIS — Z9884 Bariatric surgery status: Secondary | ICD-10-CM

## 2014-07-05 DIAGNOSIS — Z01812 Encounter for preprocedural laboratory examination: Secondary | ICD-10-CM

## 2014-07-05 DIAGNOSIS — Z6841 Body Mass Index (BMI) 40.0 and over, adult: Secondary | ICD-10-CM | POA: Diagnosis not present

## 2014-07-05 DIAGNOSIS — F17210 Nicotine dependence, cigarettes, uncomplicated: Secondary | ICD-10-CM | POA: Diagnosis present

## 2014-07-05 HISTORY — PX: UPPER GI ENDOSCOPY: SHX6162

## 2014-07-05 HISTORY — PX: LAPAROSCOPIC GASTRIC SLEEVE RESECTION: SHX5895

## 2014-07-05 LAB — CBC
HEMATOCRIT: 36.2 % (ref 36.0–46.0)
HEMOGLOBIN: 11.4 g/dL — AB (ref 12.0–15.0)
MCH: 24.7 pg — ABNORMAL LOW (ref 26.0–34.0)
MCHC: 31.5 g/dL (ref 30.0–36.0)
MCV: 78.5 fL (ref 78.0–100.0)
Platelets: 234 10*3/uL (ref 150–400)
RBC: 4.61 MIL/uL (ref 3.87–5.11)
RDW: 16.1 % — ABNORMAL HIGH (ref 11.5–15.5)
WBC: 9.9 10*3/uL (ref 4.0–10.5)

## 2014-07-05 LAB — CREATININE, SERUM
Creatinine, Ser: 0.83 mg/dL (ref 0.50–1.10)
GFR, EST NON AFRICAN AMERICAN: 90 mL/min — AB (ref >90)

## 2014-07-05 LAB — PREGNANCY, URINE: Preg Test, Ur: NEGATIVE

## 2014-07-05 LAB — HEMOGLOBIN AND HEMATOCRIT, BLOOD
HEMATOCRIT: 36.2 % (ref 36.0–46.0)
Hemoglobin: 11.4 g/dL — ABNORMAL LOW (ref 12.0–15.0)

## 2014-07-05 SURGERY — GASTRECTOMY, SLEEVE, LAPAROSCOPIC
Anesthesia: General

## 2014-07-05 MED ORDER — BUPIVACAINE LIPOSOME 1.3 % IJ SUSP
INTRAMUSCULAR | Status: DC | PRN
  Administered 2014-07-05: 20 mL

## 2014-07-05 MED ORDER — LIDOCAINE HCL (CARDIAC) 20 MG/ML IV SOLN
INTRAVENOUS | Status: AC
  Filled 2014-07-05: qty 5

## 2014-07-05 MED ORDER — DEXTROSE 5 % IV SOLN
2.0000 g | INTRAVENOUS | Status: AC
  Administered 2014-07-05: 2 g via INTRAVENOUS

## 2014-07-05 MED ORDER — OXYCODONE HCL 5 MG/5ML PO SOLN: 5.0000 mg | ORAL | Status: DC | PRN

## 2014-07-05 MED ORDER — FENTANYL CITRATE 0.05 MG/ML IJ SOLN
INTRAMUSCULAR | Status: DC | PRN
  Administered 2014-07-05 (×5): 50 ug via INTRAVENOUS
  Administered 2014-07-05: 25 ug via INTRAVENOUS
  Administered 2014-07-05: 100 ug via INTRAVENOUS
  Administered 2014-07-05 (×2): 50 ug via INTRAVENOUS
  Administered 2014-07-05: 25 ug via INTRAVENOUS

## 2014-07-05 MED ORDER — HEPARIN SODIUM (PORCINE) 5000 UNIT/ML IJ SOLN
5000.0000 [IU] | Freq: Three times a day (TID) | INTRAMUSCULAR | Status: DC
Start: 2014-07-05 — End: 2014-07-07
  Administered 2014-07-05 – 2014-07-07 (×5): 5000 [IU] via SUBCUTANEOUS
  Filled 2014-07-05 (×8): qty 1

## 2014-07-05 MED ORDER — UNJURY CHOCOLATE CLASSIC POWDER: 2.0000 [oz_av] | Freq: Four times a day (QID) | ORAL | Status: DC

## 2014-07-05 MED ORDER — 0.9 % SODIUM CHLORIDE (POUR BTL) OPTIME
TOPICAL | Status: DC | PRN
  Administered 2014-07-05: 1000 mL

## 2014-07-05 MED ORDER — HEPARIN SODIUM (PORCINE) 5000 UNIT/ML IJ SOLN
5000.0000 [IU] | INTRAMUSCULAR | Status: AC
  Administered 2014-07-05: 5000 [IU] via SUBCUTANEOUS
  Filled 2014-07-05: qty 1

## 2014-07-05 MED ORDER — METOCLOPRAMIDE HCL 5 MG/ML IJ SOLN
INTRAMUSCULAR | Status: DC | PRN
  Administered 2014-07-05: 10 mg via INTRAVENOUS

## 2014-07-05 MED ORDER — NEOSTIGMINE METHYLSULFATE 10 MG/10ML IV SOLN
INTRAVENOUS | Status: DC | PRN
  Administered 2014-07-05: 5 mg via INTRAVENOUS

## 2014-07-05 MED ORDER — HYDROMORPHONE HCL 1 MG/ML IJ SOLN
0.2500 mg | INTRAMUSCULAR | Status: DC | PRN
  Administered 2014-07-05 (×4): 0.5 mg via INTRAVENOUS

## 2014-07-05 MED ORDER — ROCURONIUM BROMIDE 100 MG/10ML IV SOLN
INTRAVENOUS | Status: DC | PRN
  Administered 2014-07-05: 25 mg via INTRAVENOUS
  Administered 2014-07-05: 15 mg via INTRAVENOUS
  Administered 2014-07-05: 40 mg via INTRAVENOUS

## 2014-07-05 MED ORDER — DEXAMETHASONE SODIUM PHOSPHATE 10 MG/ML IJ SOLN
INTRAMUSCULAR | Status: DC | PRN
  Administered 2014-07-05: 5 mg via INTRAVENOUS

## 2014-07-05 MED ORDER — ACETAMINOPHEN 160 MG/5ML PO SOLN: 325.0000 mg | ORAL | Status: DC | PRN

## 2014-07-05 MED ORDER — SODIUM CHLORIDE 0.9 % IJ SOLN
INTRAMUSCULAR | Status: DC | PRN
  Administered 2014-07-05: 10 mL via INTRAVENOUS

## 2014-07-05 MED ORDER — ONDANSETRON HCL 4 MG/2ML IJ SOLN
4.0000 mg | INTRAMUSCULAR | Status: DC | PRN
  Administered 2014-07-05 – 2014-07-06 (×5): 4 mg via INTRAVENOUS
  Filled 2014-07-05 (×5): qty 2

## 2014-07-05 MED ORDER — LIDOCAINE HCL (CARDIAC) 20 MG/ML IV SOLN
INTRAVENOUS | Status: DC | PRN
  Administered 2014-07-05: 50 mg via INTRAVENOUS

## 2014-07-05 MED ORDER — GLYCOPYRROLATE 0.2 MG/ML IJ SOLN
INTRAMUSCULAR | Status: DC | PRN
  Administered 2014-07-05: .8 mg via INTRAVENOUS

## 2014-07-05 MED ORDER — LACTATED RINGERS IV SOLN: INTRAVENOUS | Status: DC

## 2014-07-05 MED ORDER — MORPHINE SULFATE 2 MG/ML IJ SOLN
2.0000 mg | INTRAMUSCULAR | Status: DC | PRN
  Administered 2014-07-05 (×2): 4 mg via INTRAVENOUS
  Administered 2014-07-06: 2 mg via INTRAVENOUS
  Administered 2014-07-06: 4 mg via INTRAVENOUS
  Administered 2014-07-06: 2 mg via INTRAVENOUS
  Filled 2014-07-05 (×2): qty 2
  Filled 2014-07-05: qty 1
  Filled 2014-07-05: qty 2
  Filled 2014-07-05: qty 1

## 2014-07-05 MED ORDER — KCL IN DEXTROSE-NACL 20-5-0.45 MEQ/L-%-% IV SOLN
INTRAVENOUS | Status: DC
  Administered 2014-07-05: 15:00:00 via INTRAVENOUS
  Administered 2014-07-06: 1000 mL via INTRAVENOUS
  Administered 2014-07-07: 100 mL via INTRAVENOUS
  Filled 2014-07-05 (×7): qty 1000

## 2014-07-05 MED ORDER — ONDANSETRON HCL 4 MG/2ML IJ SOLN
INTRAMUSCULAR | Status: AC
  Filled 2014-07-05: qty 2

## 2014-07-05 MED ORDER — PROPOFOL 10 MG/ML IV BOLUS
INTRAVENOUS | Status: AC
Start: 2014-07-05 — End: 2014-07-05
  Filled 2014-07-05: qty 20

## 2014-07-05 MED ORDER — MIDAZOLAM HCL 5 MG/5ML IJ SOLN
INTRAMUSCULAR | Status: DC | PRN
  Administered 2014-07-05: 2 mg via INTRAVENOUS

## 2014-07-05 MED ORDER — UNJURY CHICKEN SOUP POWDER
2.0000 [oz_av] | Freq: Four times a day (QID) | ORAL | Status: DC
  Administered 2014-07-07: 2 [oz_av] via ORAL

## 2014-07-05 MED ORDER — BUPIVACAINE LIPOSOME 1.3 % IJ SUSP
20.0000 mL | Freq: Once | INTRAMUSCULAR | Status: DC
  Filled 2014-07-05: qty 20

## 2014-07-05 MED ORDER — HYDROMORPHONE HCL 1 MG/ML IJ SOLN
INTRAMUSCULAR | Status: AC
  Administered 2014-07-05: 0.5 mg via INTRAVENOUS
  Filled 2014-07-05: qty 1

## 2014-07-05 MED ORDER — DEXTROSE 5 % IV SOLN
INTRAVENOUS | Status: AC
  Filled 2014-07-05: qty 2

## 2014-07-05 MED ORDER — ACETAMINOPHEN 160 MG/5ML PO SOLN
650.0000 mg | ORAL | Status: DC | PRN
  Administered 2014-07-06: 650 mg via ORAL
  Filled 2014-07-05 (×2): qty 20.3

## 2014-07-05 MED ORDER — FENTANYL CITRATE 0.05 MG/ML IJ SOLN
INTRAMUSCULAR | Status: AC
  Filled 2014-07-05: qty 5

## 2014-07-05 MED ORDER — HYDROMORPHONE HCL 1 MG/ML IJ SOLN
INTRAMUSCULAR | Status: AC
  Filled 2014-07-05: qty 1

## 2014-07-05 MED ORDER — UNJURY VANILLA POWDER: 2.0000 [oz_av] | Freq: Four times a day (QID) | ORAL | Status: DC

## 2014-07-05 MED ORDER — SODIUM CHLORIDE 0.9 % IJ SOLN
INTRAMUSCULAR | Status: AC
  Filled 2014-07-05: qty 10

## 2014-07-05 MED ORDER — TISSEEL VH 10 ML EX KIT
PACK | CUTANEOUS | Status: AC
  Filled 2014-07-05: qty 1

## 2014-07-05 MED ORDER — ROCURONIUM BROMIDE 100 MG/10ML IV SOLN
INTRAVENOUS | Status: AC
  Filled 2014-07-05: qty 1

## 2014-07-05 MED ORDER — MIDAZOLAM HCL 2 MG/2ML IJ SOLN
INTRAMUSCULAR | Status: AC
  Filled 2014-07-05: qty 2

## 2014-07-05 MED ORDER — ONDANSETRON HCL 4 MG/2ML IJ SOLN
INTRAMUSCULAR | Status: DC | PRN
  Administered 2014-07-05: 4 mg via INTRAVENOUS

## 2014-07-05 MED ORDER — LACTATED RINGERS IR SOLN
Status: DC | PRN
  Administered 2014-07-05: 1000 mL

## 2014-07-05 MED ORDER — LACTATED RINGERS IV SOLN
INTRAVENOUS | Status: DC | PRN
  Administered 2014-07-05 (×2): via INTRAVENOUS

## 2014-07-05 MED ORDER — PROPOFOL 10 MG/ML IV BOLUS
INTRAVENOUS | Status: DC | PRN
  Administered 2014-07-05: 170 mg via INTRAVENOUS

## 2014-07-05 MED ORDER — PROPOFOL 10 MG/ML IV BOLUS
INTRAVENOUS | Status: AC
  Filled 2014-07-05: qty 20

## 2014-07-05 SURGICAL SUPPLY — 64 items
APPLICATOR COTTON TIP 6IN STRL (MISCELLANEOUS) ×4 IMPLANT
APPLIER CLIP 5 13 M/L LIGAMAX5 (MISCELLANEOUS)
APPLIER CLIP ROT 10 11.4 M/L (STAPLE)
APPLIER CLIP ROT 13.4 12 LRG (CLIP) ×4
BLADE HEX COATED 2.75 (ELECTRODE) IMPLANT
BLADE SURG 15 STRL LF DISP TIS (BLADE) ×2 IMPLANT
BLADE SURG 15 STRL SS (BLADE) ×2
CABLE HIGH FREQUENCY MONO STRZ (ELECTRODE) ×4 IMPLANT
CLIP APPLIE 5 13 M/L LIGAMAX5 (MISCELLANEOUS) IMPLANT
CLIP APPLIE ROT 10 11.4 M/L (STAPLE) IMPLANT
CLIP APPLIE ROT 13.4 12 LRG (CLIP) ×2 IMPLANT
DEVICE SUT QUICK LOAD TK 5 (STAPLE) IMPLANT
DEVICE SUT TI-KNOT TK 5X26 (MISCELLANEOUS) IMPLANT
DEVICE SUTURE ENDOST 10MM (ENDOMECHANICALS) IMPLANT
DEVICE TI KNOT TK5 (MISCELLANEOUS)
DEVICE TROCAR PUNCTURE CLOSURE (ENDOMECHANICALS) ×4 IMPLANT
DISSECTOR BLUNT TIP ENDO 5MM (MISCELLANEOUS) IMPLANT
DRAPE CAMERA CLOSED 9X96 (DRAPES) ×4 IMPLANT
ELECT REM PT RETURN 9FT ADLT (ELECTROSURGICAL) ×4
ELECTRODE REM PT RTRN 9FT ADLT (ELECTROSURGICAL) ×2 IMPLANT
GAUZE SPONGE 4X4 12PLY STRL (GAUZE/BANDAGES/DRESSINGS) IMPLANT
GAUZE SPONGE 4X4 16PLY XRAY LF (GAUZE/BANDAGES/DRESSINGS) ×4 IMPLANT
GLOVE BIOGEL M 8.0 STRL (GLOVE) ×4 IMPLANT
GOWN STRL REUS W/TWL XL LVL3 (GOWN DISPOSABLE) ×16 IMPLANT
HANDLE STAPLE EGIA 4 XL (STAPLE) ×4 IMPLANT
HOVERMATT SINGLE USE (MISCELLANEOUS) ×4 IMPLANT
KIT BASIN OR (CUSTOM PROCEDURE TRAY) ×4 IMPLANT
KIT DEFENDO BUTTON (KITS) ×4 IMPLANT
LIQUID BAND (GAUZE/BANDAGES/DRESSINGS) ×4 IMPLANT
NEEDLE SPNL 22GX3.5 QUINCKE BK (NEEDLE) ×4 IMPLANT
PACK UNIVERSAL I (CUSTOM PROCEDURE TRAY) ×4 IMPLANT
PEN SKIN MARKING BROAD (MISCELLANEOUS) ×4 IMPLANT
QUICK LOAD TK 5 (STAPLE)
RELOAD ENDO STITCH (ENDOMECHANICALS) IMPLANT
RELOAD TRI 45 ART MED THCK BLK (STAPLE) ×4 IMPLANT
RELOAD TRI 45 ART MED THCK PUR (STAPLE) IMPLANT
RELOAD TRI 60 ART MED THCK BLK (STAPLE) ×4 IMPLANT
RELOAD TRI 60 ART MED THCK PUR (STAPLE) ×8 IMPLANT
SCISSORS LAP 5X45 EPIX DISP (ENDOMECHANICALS) ×4 IMPLANT
SCRUB PCMX 4 OZ (MISCELLANEOUS) ×4 IMPLANT
SEALANT SURGICAL APPL DUAL CAN (MISCELLANEOUS) IMPLANT
SET IRRIG TUBING LAPAROSCOPIC (IRRIGATION / IRRIGATOR) ×4 IMPLANT
SHEARS CURVED HARMONIC AC 45CM (MISCELLANEOUS) ×4 IMPLANT
SLEEVE ADV FIXATION 5X100MM (TROCAR) ×8 IMPLANT
SLEEVE GASTRECTOMY 36FR VISIGI (MISCELLANEOUS) ×4 IMPLANT
SOLUTION ANTI FOG 6CC (MISCELLANEOUS) ×4 IMPLANT
SPONGE LAP 18X18 X RAY DECT (DISPOSABLE) ×4 IMPLANT
STAPLER VISISTAT 35W (STAPLE) ×4 IMPLANT
SUT VIC AB 4-0 SH 18 (SUTURE) ×4 IMPLANT
SUT VICRYL 0 TIES 12 18 (SUTURE) ×4 IMPLANT
SYR 20CC LL (SYRINGE) ×4 IMPLANT
SYR 50ML LL SCALE MARK (SYRINGE) ×4 IMPLANT
TOWEL OR 17X26 10 PK STRL BLUE (TOWEL DISPOSABLE) ×8 IMPLANT
TOWEL OR NON WOVEN STRL DISP B (DISPOSABLE) ×4 IMPLANT
TRAY FOLEY CATH 14FRSI W/METER (CATHETERS) IMPLANT
TROCAR ADV FIXATION 12X100MM (TROCAR) ×4 IMPLANT
TROCAR ADV FIXATION 5X100MM (TROCAR) ×4 IMPLANT
TROCAR BLADELESS 15MM (ENDOMECHANICALS) ×4 IMPLANT
TROCAR BLADELESS OPT 5 100 (ENDOMECHANICALS) ×4 IMPLANT
TUBE CALIBRATION LAPBAND (TUBING) IMPLANT
TUBING CONNECTING 10 (TUBING) ×3 IMPLANT
TUBING CONNECTING 10' (TUBING) ×1
TUBING ENDO SMARTCAP (MISCELLANEOUS) ×4 IMPLANT
TUBING FILTER THERMOFLATOR (ELECTROSURGICAL) ×4 IMPLANT

## 2014-07-05 NOTE — Op Note (Signed)
Angelica Flores [id number] [birth year] 07/05/2014  Preoperative diagnosis: morbid obesity  Postoperative diagnosis: Same   Procedure: Upper endoscopy   Surgeon: Mary SellaEric M. Jameela Michna M.D., FACS   Anesthesia: Gen.   Indications for procedure: [age]year old female undergoing Laparoscopic Gastric Sleeve Resection and an EGD was requested to evaluate the new gastric sleeve.   Description of procedure: After we have completed the sleeve resection, I scrubbed out and obtained the Olympus endoscope. I gently placed endoscope in the patient's oropharynx and gently glided it down the esophagus without any difficulty under direct visualization. Once I was in the gastric sleeve, I insufflated the stomach with air. I was able to cannulate and advanced the scope through the gastric sleeve. I was able to cannulate the duodenum with ease. Dr. Daphine DeutscherMartin had placed saline in the upper abdomen. Upon further insufflation of the gastric sleeve there was no evidence of bubbles. GE junction located at 40 cm.  Upon further inspection of the gastric sleeve, the mucosa appeared normal. There is no evidence of any mucosal abnormality. The sleeve was widely patent at the angularis. There was no evidence of bleeding. The gastric sleeve was decompressed. The scope was withdrawn. The patient tolerated this portion of the procedure well. Please see Dr Ermalene SearingMartin's operative note for details regarding the laparoscopic gastric sleeve resection.   Mary SellaEric M. Andrey CampanileWilson, MD, FACS  General, Bariatric, & Minimally Invasive Surgery  [facility] [place]

## 2014-07-05 NOTE — Anesthesia Postprocedure Evaluation (Signed)
  Anesthesia Post-op Note  Patient: Angelica Flores  Procedure(s) Performed: Procedure(s) (LRB): LAPAROSCOPIC GASTRIC SLEEVE RESECTION W/UPPER GI (N/A) UPPER GI ENDOSCOPY  Patient Location: PACU  Anesthesia Type: General  Level of Consciousness: awake and alert   Airway and Oxygen Therapy: Patient Spontanous Breathing  Post-op Pain: mild  Post-op Assessment: Post-op Vital signs reviewed, Patient's Cardiovascular Status Stable, Respiratory Function Stable, Patent Airway and No signs of Nausea or vomiting  Last Vitals:  Filed Vitals:   07/05/14 1130  BP: 125/68  Pulse: 71  Temp: 36.8 C  Resp: 18    Post-op Vital Signs: stable   Complications: No apparent anesthesia complications

## 2014-07-05 NOTE — Plan of Care (Signed)
Problem: Phase I Progression Outcomes Goal: Pain controlled with appropriate interventions Outcome: Progressing Pt educated about pain management. Goal: Diet - NPO Outcome: Progressing Pt educated on NPO status.

## 2014-07-05 NOTE — H&P (View-Only) (Signed)
Chief Complaint:  Morbid obesity BMI 47.6 and a weight of 274.  History of Present Illness:  Angelica Flores is an 38 y.o. female initially seen earlier this year by Dr. Ezzard StandingNewman and she is a [place] healthcare patient who has qualified for laparoscopic sleeve gastrectomy.  I have seen and examined her preoperatively and have answered all of her questions regarding sleeve gastrectomy.  She has done a thorough job researching this including watching online videos of the procedure itself and I described how I do the procedure in detail.  She is aware of the risk of bleeding and leak.  Her mother accompanies her on this visit and appears to be a very supportive of her decision to undergo sleeve gastrectomy.  Her past medical history is remarkable in that she did have some ureteral lengthening procedure as a child at the [facility] when she was 38 years old.  She said the only allergic problem she had with penicillin which she got sick and vomited.  I indicated this may not be a true allergies.  She says she's had problem with her left knee and may have a problem with her cruciate ligaments.  She denies any history of DVT.  We'll proceed with laparoscopic sleeve gastrectomy.  Past Medical History  Diagnosis Date  . Obesity   . Sciatic pain     Past Surgical History  Procedure Laterality Date  . Vur    . Breath tek h pylori N/A 09/02/2013    Procedure: BREATH TEK H PYLORI;  Surgeon: Kandis Cockingavid H Newman, MD;  [facility];  Service: General;  Laterality: N/A;    Current Outpatient Prescriptions  Medication Sig Dispense Refill  . Aspirin-Cinnamedrine-Caffeine (MIDOL MAXIMUM STRENGTH) 500-15-32 MG TABS Take 1 tablet by mouth 3 (three) times daily as needed. 90 each 5  . meloxicam (MOBIC) 15 MG tablet Take 1 tablet (15 mg total) by mouth daily. 30 tablet 0  . MONONESSA 0.25-35 MG-MCG tablet     . Prenatal Vit-Fe Sulfate-FA (PRENATAL VITAMIN PO) Take by mouth daily.     No current  facility-administered medications for this visit.   Review of patient's allergies indicates no known allergies. Family History  Problem Relation Age of Onset  . Hypertension Other   . Obesity Other    Social History:   reports that she has been smoking Cigarettes.  She has been smoking about 0.50 packs per day. She does not have any smokeless tobacco history on file. She reports that she does not drink alcohol or use illicit drugs.   REVIEW OF SYSTEMS : Negative except for prior urologic surgery as a child.  Physical Exam:   There were no vitals taken for this visit. There is no weight on file to calculate BMI.  Gen:  WDWN African-American female NAD  Neurological: Alert and oriented to person, place, and time. Motor and sensory function is grossly intact  Head: Normocephalic and atraumatic.  Eyes: Conjunctivae are normal. Pupils are equal, round, and reactive to light. No scleral icterus.  Neck: Normal range of motion. Neck supple. No tracheal deviation or thyromegaly present.  Cardiovascular:  SR without murmurs or gallops.  No carotid bruits Breast:  Not examined Respiratory: Effort normal.  No respiratory distress. No chest wall tenderness. Breath sounds normal.  No wheezes, rales or rhonchi.  Abdomen:  Obese nontender GU:  Not examined  Musculoskeletal: Normal range of motion. Extremities are nontender. No cyanosis, edema or clubbing noted Lymphadenopathy: No cervical, preauricular, postauricular or  axillary adenopathy is present Skin: Skin is warm and dry. No rash noted. No diaphoresis. No erythema. No pallor. Pscyh: Normal mood and affect. Behavior is normal. Judgment and thought content normal.   LABORATORY RESULTS: No results found for this or any previous visit (from the past 48 hour(s)).   RADIOLOGY RESULTS: No results found.  Problem List: Patient Active Problem List   Diagnosis Date Noted  . Morbid obesity 08/12/2013    Assessment & Plan: Morbid obesity and  desire for sleeve gastrectomy.  Plan December 29 sleeve gastrectomy.    Matt B. Daphine DeutscherMartin, MD, [facility]  [facility] [phone] beeper [phone]  06/23/2014 11:58 AM

## 2014-07-05 NOTE — Op Note (Signed)
Surgeon: Angelica LowMatt Lonni Dirden, MD, FACS  Asst:  Gaynelle AduEric Wilson, MD, FACS  Anes:  General endotracheal  Procedure: Laparoscopic sleeve gastrectomy and upper endoscopy  Diagnosis: Morbid obesity  Complications: none  EBL:   50 cc  Description of Procedure:  The patient was take to OR 1 and given general anesthesia.  The abdomen was prepped with PCMX and draped sterilely.  A timeout was performed.  Access to the abdomen was achieved with a five mm Optiview technique without difficulty.  Following insufflation, the state of the abdomen was found to be free of adhesions.  The gallbladder known to contain 7 mm gallstones had the pale clay look of chronic cholecystitis.  The ViSiGi 36Fr tube was inserted to deflate the stomach and was pulled back into the esophagus.    The pylorus was identified and we measured 5 cm back and marked the antrum.  At that point we began dissection to take down the greater curvature of the stomach using the Harmonic scalpel.  This dissection was taken all the way up to the left crus.  Some bleeding at the top of the spleen was controlled with the Harmonic and a sponge which was removed and no bleeding was seen.  Tisseal was applied at the end of the case.   Posterior attachments of the stomach were also taken down.    The ViSiGi tube was then passed into the antrum and suction applied so that it was snug along the lessor curvature.  The "crow's foot" or incisura was identified.  The sleeve gastrectomy was begun using the [employer] platform stapler beginning with a 4.5 cm Black load with TRS.  This was applied about 2 cm in leaving a little tail of TRS.  This was followed by a 6 cm black load and then multiple firings of purple loads with TRS.  When the sleeve was complete the tube was taken off suction and insufflated briefly.  The tube was withdrawn.  Upper endoscopy was then performed by Dr. Andrey CampanileWilson which showed no bleeding or bubbles.     The specimen was extracted through the 15  trocar site.  Wounds were infiltrated with Exparel  and closed with 4-0 vicryl and Liquiban.  The 15 mm port to the right and above the umbilicus was closed with a 0 vicryl and the endoclose.    Matt B. Daphine DeutscherMartin, MD, [facility], [place] [phone]

## 2014-07-05 NOTE — Progress Notes (Signed)
Patient alert and oriented, Op day.  Provided support and encouragement.  Encouraged pulmonary toilet and ambulation.  Discussed Plan of care and expected timeline for POD 1.  All questions answered.  Will continue to monitor.

## 2014-07-05 NOTE — Progress Notes (Signed)
Pt ambulated in hallway and to the restroom. Pt had no difficulty and was a stand by assist for this first time being out of bed since surgery. Pt c/o of nothing. Pt tolerated the walk very well and is back in bed at this time. Pt to get up later this evening to walk again.

## 2014-07-05 NOTE — Anesthesia Preprocedure Evaluation (Signed)
Anesthesia Evaluation  Patient identified by MRN, date of birth, ID band Patient awake    Reviewed: Allergy & Precautions, H&P , NPO status , Patient's Chart, lab work & pertinent test results  History of Anesthesia Complications (+) PONV  Airway Mallampati: II  TM Distance: >3 FB Neck ROM: full    Dental no notable dental hx.    Pulmonary neg pulmonary ROS, former smoker,  breath sounds clear to auscultation  Pulmonary exam normal       Cardiovascular Exercise Tolerance: Good negative cardio ROS  Rhythm:regular Rate:Normal     Neuro/Psych negative neurological ROS  negative psych ROS   GI/Hepatic negative GI ROS, Neg liver ROS,   Endo/Other  negative endocrine ROSMorbid obesity  Renal/GU negative Renal ROS  negative genitourinary   Musculoskeletal   Abdominal (+) + obese,   Peds  Hematology negative hematology ROS (+)   Anesthesia Other Findings   Reproductive/Obstetrics negative OB ROS                             Anesthesia Physical Anesthesia Plan  ASA: III  Anesthesia Plan: General   Post-op Pain Management:    Induction: Intravenous  Airway Management Planned: Oral ETT  Additional Equipment:   Intra-op Plan:   Post-operative Plan: Extubation in OR  Informed Consent: I have reviewed the patients History and Physical, chart, labs and discussed the procedure including the risks, benefits and alternatives for the proposed anesthesia with the patient or authorized representative who has indicated his/her understanding and acceptance.   Dental Advisory Given  Plan Discussed with: CRNA and Surgeon  Anesthesia Plan Comments:         Anesthesia Quick Evaluation

## 2014-07-05 NOTE — Discharge Instructions (Signed)

## 2014-07-05 NOTE — Interval H&P Note (Signed)
History and Physical Interval Note:  07/05/2014 7:06 AM  Angelica ClarkAnnette Flores  has presented today for surgery, with the diagnosis of Morbid Obesity  The various methods of treatment have been discussed with the patient and family. After consideration of risks, benefits and other options for treatment, the patient has consented to  Procedure(s): LAPAROSCOPIC GASTRIC SLEEVE RESECTION W/UPPER GI (N/A) as a surgical intervention .  The patient's history has been reviewed, patient examined, no change in status, stable for surgery.  I have reviewed the patient's chart and labs.  Questions were answered to the patient's satisfaction.     Neira Bentsen B

## 2014-07-05 NOTE — Transfer of Care (Signed)
Immediate Anesthesia Transfer of Care Note  Patient: Angelica Flores  Procedure(s) Performed: Procedure(s): LAPAROSCOPIC GASTRIC SLEEVE RESECTION W/UPPER GI (N/A) UPPER GI ENDOSCOPY  Patient Location: PACU  Anesthesia Type:General  Level of Consciousness: awake, alert , oriented and patient cooperative  Airway & Oxygen Therapy: Patient Spontanous Breathing and Patient connected to face mask oxygen  Post-op Assessment: Report given to PACU RN and Post -op Vital signs reviewed and stable  Post vital signs: Reviewed and stable  Complications: No apparent anesthesia complications

## 2014-07-05 NOTE — Progress Notes (Signed)
Patient alert and oriented, pain is controlled. Patient is tolerating fluids, plan to advance to water tomorrow after swallow study.  Reviewed Gastric sleeve discharge instructions with patient and patient is able to articulate understanding.  Provided information on BELT program, Support Group and [facility] outpatient pharmacy. All questions answered, will continue to monitor.

## 2014-07-06 ENCOUNTER — Inpatient Hospital Stay (HOSPITAL_COMMUNITY): Payer: 59

## 2014-07-06 LAB — CBC WITH DIFFERENTIAL/PLATELET
Basophils Absolute: 0 10*3/uL (ref 0.0–0.1)
Basophils Relative: 0 % (ref 0–1)
Eosinophils Absolute: 0 10*3/uL (ref 0.0–0.7)
Eosinophils Relative: 0 % (ref 0–5)
HCT: 34.1 % — ABNORMAL LOW (ref 36.0–46.0)
HEMOGLOBIN: 11.3 g/dL — AB (ref 12.0–15.0)
LYMPHS ABS: 1.6 10*3/uL (ref 0.7–4.0)
Lymphocytes Relative: 17 % (ref 12–46)
MCH: 25.4 pg — ABNORMAL LOW (ref 26.0–34.0)
MCHC: 33.1 g/dL (ref 30.0–36.0)
MCV: 76.6 fL — ABNORMAL LOW (ref 78.0–100.0)
MONOS PCT: 9 % (ref 3–12)
Monocytes Absolute: 0.9 10*3/uL (ref 0.1–1.0)
NEUTROS ABS: 7 10*3/uL (ref 1.7–7.7)
Neutrophils Relative %: 74 % (ref 43–77)
Platelets: 222 10*3/uL (ref 150–400)
RBC: 4.45 MIL/uL (ref 3.87–5.11)
RDW: 16 % — ABNORMAL HIGH (ref 11.5–15.5)
WBC: 9.5 10*3/uL (ref 4.0–10.5)

## 2014-07-06 LAB — HEMOGLOBIN AND HEMATOCRIT, BLOOD
HCT: 34.9 % — ABNORMAL LOW (ref 36.0–46.0)
Hemoglobin: 11.1 g/dL — ABNORMAL LOW (ref 12.0–15.0)

## 2014-07-06 MED ORDER — IOHEXOL 300 MG/ML  SOLN
50.0000 mL | Freq: Once | INTRAMUSCULAR | Status: AC | PRN
  Administered 2014-07-06: 50 mL via ORAL

## 2014-07-06 MED ORDER — IOHEXOL 300 MG/ML  SOLN: 50.0000 mL | Freq: Once | INTRAMUSCULAR | Status: DC | PRN

## 2014-07-06 NOTE — Plan of Care (Signed)
Problem: Food- and Nutrition-Related Knowledge Deficit (NB-1.1) Goal: Nutrition education Formal process to instruct or train a patient/client in a skill or to impart knowledge to help patients/clients voluntarily manage or modify food choices and eating behavior to maintain or improve health. Outcome: Completed/Met Date Met:  07/06/14 Nutrition Education Note  Received consult for diet education per DROP protocol.   S/p 12/29 laparoscopic sleeve gastrectomy   Discussed 2 week post op diet with pt. Emphasized that liquids must be non carbonated, non caffeinated, and sugar free. Fluid goals discussed. Pt to follow up with outpatient bariatric RD for further diet progression after 2 weeks. Multivitamins and minerals also reviewed. Teach back method used, pt expressed understanding, expect good compliance.   Diet: First 2 Weeks  You will see the nutritionist about two (2) weeks after your surgery. The nutritionist will increase the types of foods you can eat if you are handling liquids well:  If you have severe vomiting or nausea and cannot handle clear liquids lasting longer than 1 day, call your surgeon  Protein Shake  Drink at least 2 ounces of shake 5-6 times per day  Each serving of protein shakes (usually 8 - 12 ounces) should have a minimum of:  15 grams of protein  And no more than 5 grams of carbohydrate  Goal for protein each day:  Men = 80 grams per day  Women = 60 grams per day  Protein powder may be added to fluids such as non-fat milk or Lactaid milk or Soy milk (limit to 35 grams added protein powder per serving)   Hydration  Slowly increase the amount of water and other clear liquids as tolerated (See Acceptable Fluids)  Slowly increase the amount of protein shake as tolerated  Sip fluids slowly and throughout the day  May use sugar substitutes in small amounts (no more than 6 - 8 packets per day; i.e. Splenda)   Fluid Goal  The first goal is to drink at least 8 ounces  of protein shake/drink per day (or as directed by the nutritionist); some examples of protein shakes are [employer], [employer], EAS Edge HP, and Unjury. See handout from pre-op Bariatric Education Class:  Slowly increase the amount of protein shake you drink as tolerated  You may find it easier to slowly sip shakes throughout the day  It is important to get your proteins in first  Your fluid goal is to drink 64 - 100 ounces of fluid daily  It may take a few weeks to build up to this  32 oz (or more) should be clear liquids  And  32 oz (or more) should be full liquids (see below for examples)  Liquids should not contain sugar, caffeine, or carbonation   Clear Liquids:  Water or Sugar-free flavored water (i.e. Fruit H2O, Propel)  Decaffeinated coffee or tea (sugar-free)  Crystal Lite, Wyler's Lite, Minute Maid Lite  Sugar-free Jell-O  Bouillon or broth  Sugar-free Popsicle: *Less than 20 calories each; Limit 1 per day   Full Liquids:  Protein Shakes/Drinks + 2 choices per day of other full liquids  Full liquids must be:  No More Than 12 grams of Carbs per serving  No More Than 3 grams of Fat per serving  Strained low-fat cream soup  Non-Fat milk  Fat-free Lactaid Milk  Sugar-free yogurt (Dannon Lite & Fit, Greek yogurt)     Clayton Bibles, MS, RD, LDN Pager: [phone] After Hours Pager: [phone]

## 2014-07-06 NOTE — Progress Notes (Signed)
Patient alert and oriented, Post op day 1.  Provided support and encouragement.  Encouraged pulmonary toilet, ambulation and small sips of liquids.  All questions answered.  Will continue to monitor. 

## 2014-07-06 NOTE — Care Management Note (Signed)
    Page 1 of 1   07/06/2014     10:44:49 AM CARE MANAGEMENT NOTE 07/06/2014  Patient:  Angelica Flores,Angelica Flores   Account Number:  [id number]  Date Initiated:  07/06/2014  Documentation initiated by:  Lorenda IshiharaPEELE,Danah Reinecke  Subjective/Objective Assessment:   38 yo female admitted with SBO.  PTA lived at home alone.     Action/Plan:   Home when stable   Anticipated DC Date:  07/08/2014   Anticipated DC Plan:  HOME/SELF CARE      DC Planning Services  CM consult      Choice offered to / List presented to:             Status of service:  Completed, signed off Medicare Important Message given?   (If response is "NO", the following Medicare IM given date fields will be blank) Date Medicare IM given:   Medicare IM given by:   Date Additional Medicare IM given:   Additional Medicare IM given by:    Discharge Disposition:  HOME/SELF CARE  Per UR Regulation:  Reviewed for med. necessity/level of care/duration of stay  If discussed at Long Length of Stay Meetings, dates discussed:    Comments:

## 2014-07-06 NOTE — Progress Notes (Signed)
Patient ID: Angelica Flores, female   DOB: [birth year], 38 y.o.   MRN: [id number] [facility] Surgery Progress Note:   1 Day Post-Op  Subjective: Mental status is clear.  Doing well.  Objective: Vital signs in last 24 hours: Temp:  [97.8 F (36.6 C)-99.4 F (37.4 C)] 98.9 F (37.2 C) (12/30 0525) Pulse Rate:  [65-87] 70 (12/30 0525) Resp:  [11-22] 18 (12/30 0525) BP: (106-125)/(58-85) 115/72 mmHg (12/30 0525) SpO2:  [97 %-100 %] 100 % (12/30 0525)  Intake/Output from previous day: 12/29 0701 - 12/30 0700 In: 3005 [I.V.:3005] Out: 3250 [Urine:3230; Blood:20] Intake/Output this shift:    Physical Exam: Work of breathing is normal.  Minimal pain  Lab Results:  Results for orders placed or performed during the hospital encounter of 07/05/14 (from the past 48 hour(s))  Pregnancy, urine STAT morning of surgery     Status: None   Collection Time: 07/05/14  6:15 AM  Result Value Ref Range   Preg Test, Ur NEGATIVE NEGATIVE    Comment:        THE SENSITIVITY OF THIS METHODOLOGY IS >20 mIU/mL.   Hemoglobin and hematocrit, blood     Status: Abnormal   Collection Time: 07/05/14 10:02 AM  Result Value Ref Range   Hemoglobin 11.4 (L) 12.0 - 15.0 g/dL   HCT 36.2 36.0 - 46.0 %  CBC     Status: Abnormal   Collection Time: 07/05/14 10:02 AM  Result Value Ref Range   WBC 9.9 4.0 - 10.5 K/uL   RBC 4.61 3.87 - 5.11 MIL/uL   Hemoglobin 11.4 (L) 12.0 - 15.0 g/dL   HCT 36.2 36.0 - 46.0 %   MCV 78.5 78.0 - 100.0 fL   MCH 24.7 (L) 26.0 - 34.0 pg   MCHC 31.5 30.0 - 36.0 g/dL   RDW 16.1 (H) 11.5 - 15.5 %   Platelets 234 150 - 400 K/uL  Creatinine, serum     Status: Abnormal   Collection Time: 07/05/14 10:02 AM  Result Value Ref Range   Creatinine, Ser 0.83 0.50 - 1.10 mg/dL   GFR calc non Af Amer 90 (L) >90 mL/min   GFR calc Af Amer >90 >90 mL/min    Comment: (NOTE) The eGFR has been calculated using the CKD EPI equation. This calculation has not been validated in all clinical  situations. eGFR's persistently <90 mL/min signify possible Chronic Kidney Disease.   CBC WITH DIFFERENTIAL     Status: Abnormal   Collection Time: 07/06/14  5:17 AM  Result Value Ref Range   WBC 9.5 4.0 - 10.5 K/uL   RBC 4.45 3.87 - 5.11 MIL/uL   Hemoglobin 11.3 (L) 12.0 - 15.0 g/dL   HCT 34.1 (L) 36.0 - 46.0 %   MCV 76.6 (L) 78.0 - 100.0 fL   MCH 25.4 (L) 26.0 - 34.0 pg   MCHC 33.1 30.0 - 36.0 g/dL   RDW 16.0 (H) 11.5 - 15.5 %   Platelets 222 150 - 400 K/uL   Neutrophils Relative % 74 43 - 77 %   Neutro Abs 7.0 1.7 - 7.7 K/uL   Lymphocytes Relative 17 12 - 46 %   Lymphs Abs 1.6 0.7 - 4.0 K/uL   Monocytes Relative 9 3 - 12 %   Monocytes Absolute 0.9 0.1 - 1.0 K/uL   Eosinophils Relative 0 0 - 5 %   Eosinophils Absolute 0.0 0.0 - 0.7 K/uL   Basophils Relative 0 0 - 1 %   Basophils Absolute 0.0  0.0 - 0.1 K/uL    Radiology/Results: No results found.  Anti-infectives: Anti-infectives    Start     Dose/Rate Route Frequency Ordered Stop   07/05/14 0534  cefOXitin (MEFOXIN) 2 g in dextrose 5 % 50 mL IVPB     2 g100 mL/hr over 30 Minutes Intravenous On call to O.R. 07/05/14 0534 07/05/14 0720      Assessment/Plan: Problem List: Patient Active Problem List   Diagnosis Date Noted  . S/P laparoscopic sleeve gastrectomy Dec 2015 07/05/2014  . Morbid obesity 08/12/2013    Awaiting UGI.  Advance diet accordingly 1 Day Post-Op    LOS: 1 day   Matt B. Hassell Done, MD, [facility] [phone] beeper [phone]  07/06/2014 8:38 AM

## 2014-07-07 ENCOUNTER — Encounter (HOSPITAL_COMMUNITY): Payer: Self-pay | Admitting: Surgery

## 2014-07-07 LAB — CBC WITH DIFFERENTIAL/PLATELET
BASOS ABS: 0 10*3/uL (ref 0.0–0.1)
Basophils Relative: 0 % (ref 0–1)
Eosinophils Absolute: 0.1 10*3/uL (ref 0.0–0.7)
Eosinophils Relative: 1 % (ref 0–5)
HEMATOCRIT: 35.6 % — AB (ref 36.0–46.0)
HEMOGLOBIN: 11.2 g/dL — AB (ref 12.0–15.0)
LYMPHS PCT: 32 % (ref 12–46)
Lymphs Abs: 2.3 10*3/uL (ref 0.7–4.0)
MCH: 24.6 pg — ABNORMAL LOW (ref 26.0–34.0)
MCHC: 31.5 g/dL (ref 30.0–36.0)
MCV: 78.1 fL (ref 78.0–100.0)
MONO ABS: 0.5 10*3/uL (ref 0.1–1.0)
Monocytes Relative: 7 % (ref 3–12)
NEUTROS ABS: 4.2 10*3/uL (ref 1.7–7.7)
Neutrophils Relative %: 60 % (ref 43–77)
Platelets: 257 10*3/uL (ref 150–400)
RBC: 4.56 MIL/uL (ref 3.87–5.11)
RDW: 16.2 % — ABNORMAL HIGH (ref 11.5–15.5)
WBC: 7.1 10*3/uL (ref 4.0–10.5)

## 2014-07-07 NOTE — Discharge Summary (Signed)
Physician Discharge Summary  Patient ID: Angelica Flores MRN: [id number] DOB/AGE: Jul 29, 1975 38 y.o.  Admit date: 07/05/2014 Discharge date: 07/07/2014  Admission Diagnoses:  Morbid obesity  Discharge Diagnoses:  same  Active Problems:   S/P laparoscopic sleeve gastrectomy Dec 2015   Surgery:  Sleeve gastrectomy  Discharged Condition: improved  Hospital Course:   Had surgery.  UGI on PD 1 looked OK.  Ready for discharge on PD 2  Consults: none  Significant Diagnostic Studies: UGI OK    Discharge Exam: Blood pressure 102/56, pulse 68, temperature 98.6 F (37 C), temperature source Oral, resp. rate 18, height 5\' 3"  (1.6 m), weight 272 lb 2 oz (123.435 kg), last menstrual period 07/05/2014, SpO2 99 %. Incisions ok  Disposition: 01-Home or Self Care  Discharge Instructions    Discharge instructions    Complete by:  As directed   Follow bariatric dietary guidelines     Increase activity slowly    Complete by:  As directed             Medication List    TAKE these medications        PRENATAL VITAMIN PO  Take 1 tablet by mouth daily.           Follow-up Information    Follow up with Valarie MerinoMARTIN,Angelica Mcmanaway B, MD.   Specialty:  General Surgery   Contact information:   [address] [place] [address] [phone]       Signed: Valarie MerinoMARTIN,Angelica Flores 07/07/2014, 8:56 AM

## 2014-07-07 NOTE — Progress Notes (Signed)
Assessment unchanged. Tolerated  2 ounces of chicken soup Unjury. Denies pain at present. Ambulating frequently. Pt verbalized understanding of dc instructions through teach back with no further questions offered. No scripts at dc. Discharged via wc to front entrance to meet awaiting vehicle to carry home. Accompanied by NT, mother and son.

## 2014-07-11 ENCOUNTER — Telehealth (HOSPITAL_COMMUNITY): Payer: Self-pay

## 2014-07-11 NOTE — Telephone Encounter (Signed)
Made discharge phone call to patient per DROP protocol. Asking the following questions.    1. Do you have someone to care for you now that you are home?  yes 2. Are you having pain now that is not relieved by your pain medication?  no 3. Are you able to drink the recommended daily amount of fluids (48 ounces minimum/day) and protein (60-80 grams/day) as prescribed by the dietitian or nutritional counselor?  It is a battle, I can get my protein in but then I am getting about 20-30 ounces of liquid in -- but it is increasing daily 4. Are you taking the vitamins and minerals as prescribed?  yes 5. Do you have the "on call" number to contact your surgeon if you have a problem or question?  yes 6. Are your incisions free of redness, swelling or drainage? (If steri strips, address that these can fall off, shower as tolerated) yes 7. Have your bowels moved since your surgery?  If not, are you passing gas?  yes 8. Are you up and walking 3-4 times per day?  yes    1. Do you have an appointment made to see your surgeon in the next month?  yes 2. Were you provided your discharge medications before your surgery or before you were discharged from the hospital and are you taking them without problem?  yes 3. Were you provided phone numbers to the clinic/surgeon's office?  yes 4. Did you watch the patient education video module in the (clinic, surgeon's office, etc.) before your surgery? yes 5. Do you have a discharge checklist that was provided to you in the hospital to reference with instructions on how to take care of yourself after surgery?  yes 6. Did you see a dietitian or nutritional counselor while you were in the hospital?  yes 7. Do you have an appointment to see a dietitian or nutritional counselor in the next month?  yes

## 2014-07-19 ENCOUNTER — Encounter: Payer: 59 | Attending: Surgery

## 2014-07-19 DIAGNOSIS — Z6841 Body Mass Index (BMI) 40.0 and over, adult: Secondary | ICD-10-CM | POA: Insufficient documentation

## 2014-07-19 DIAGNOSIS — Z713 Dietary counseling and surveillance: Secondary | ICD-10-CM | POA: Insufficient documentation

## 2014-07-19 NOTE — Progress Notes (Signed)
Bariatric Class:  Appt start time: 1530 end time:  1630.  2 Week Post-Operative Nutrition Class  Patient was seen on 07/19/2014 for Post-Operative Nutrition education [facility].    Surgery date: 07/05/14 Surgery type: gastric sleeve Start weight at [facility]: 286 lbs on 08/25/13 Weight today: 260.0 lbs  Weight change: 14.5 lbs  TANITA  BODY COMP RESULTS  06/20/14 07/19/14   BMI (kg/m^2) 47.9 45.3   Fat Mass (lbs) 141.5 135   Fat Free Mass (lbs) 133 125   Total Body Water (lbs) 97.5 91.5     The following the learning objectives were met by the patient during this course:  Identifies Phase 3A (Soft, High Proteins) Dietary Goals and will begin from 2 weeks post-operatively to 2 months post-operatively  Identifies appropriate sources of fluids and proteins   States protein recommendations and appropriate sources post-operatively  Identifies the need for appropriate texture modifications, mastication, and bite sizes when consuming solids  Identifies appropriate multivitamin and calcium sources post-operatively  Describes the need for physical activity post-operatively and will follow MD recommendations  States when to call healthcare provider regarding medication questions or post-operative complications  Handouts given during class include:  Phase 3A: Soft, High Protein Diet Handout  Follow-Up Plan: Patient will follow-up at [facility] in 6 weeks for 2 month post-op nutrition visit for diet advancement per MD.

## 2014-07-22 ENCOUNTER — Other Ambulatory Visit: Payer: Self-pay | Admitting: Obstetrics and Gynecology

## 2014-07-22 ENCOUNTER — Other Ambulatory Visit (HOSPITAL_COMMUNITY)
Admission: RE | Admit: 2014-07-22 | Discharge: 2014-07-22 | Disposition: A | Discharge status: Home / Self Care | Payer: 59 | Source: Ambulatory Visit | Attending: Obstetrics and Gynecology | Admitting: Obstetrics and Gynecology

## 2014-07-22 DIAGNOSIS — Z113 Encounter for screening for infections with a predominantly sexual mode of transmission: Secondary | ICD-10-CM | POA: Diagnosis present

## 2014-07-22 DIAGNOSIS — Z01419 Encounter for gynecological examination (general) (routine) without abnormal findings: Secondary | ICD-10-CM | POA: Diagnosis not present

## 2014-07-25 LAB — CYTOLOGY - PAP

## 2014-08-29 ENCOUNTER — Ambulatory Visit: Payer: 59 | Admitting: Dietician

## 2015-07-28 ENCOUNTER — Other Ambulatory Visit (HOSPITAL_COMMUNITY)
Admission: RE | Admit: 2015-07-28 | Discharge: 2015-07-28 | Disposition: A | Discharge status: Home / Self Care | Payer: 59 | Source: Ambulatory Visit | Attending: Obstetrics and Gynecology | Admitting: Obstetrics and Gynecology

## 2015-07-28 ENCOUNTER — Other Ambulatory Visit: Payer: Self-pay | Admitting: Obstetrics and Gynecology

## 2015-07-28 DIAGNOSIS — Z113 Encounter for screening for infections with a predominantly sexual mode of transmission: Secondary | ICD-10-CM | POA: Insufficient documentation

## 2015-07-28 DIAGNOSIS — Z01419 Encounter for gynecological examination (general) (routine) without abnormal findings: Secondary | ICD-10-CM | POA: Diagnosis not present

## 2015-07-31 LAB — CYTOLOGY - PAP

## 2016-03-22 IMAGING — RF DG UGI W/ GASTROGRAFIN
6 of 7 series · 15 of 24 positions shown · IV contrast (omnipaque)
Comparison: 09/03/2013

FLUOROSCOPY TIME:  1 min

CLINICAL DATA: Status post laparoscopic sleeve gastrectomy

EXAM:
WATER SOLUBLE UPPER GI SERIES
TECHNIQUE: Single-column upper GI series was performed using water soluble
contrast.
CONTRAST:  50mL OMNIPAQUE IOHEXOL 300 MG/ML  SOLN

[Series 1: run · 3 of 22 slices shown (1 of 5)]
[im 1/22]
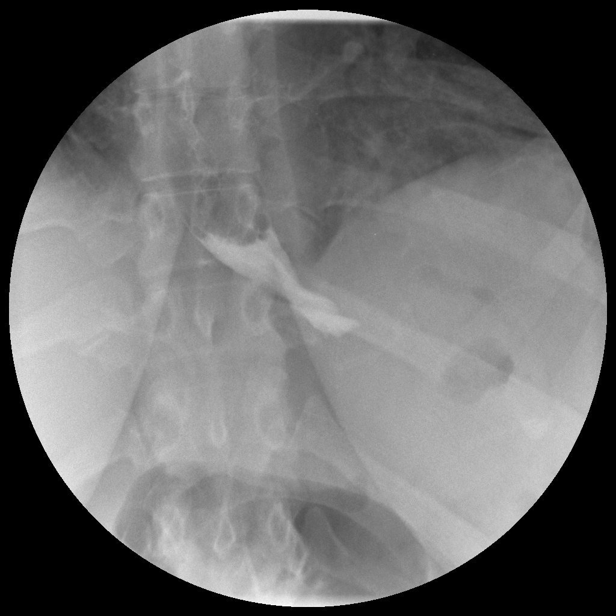
[im 11/22]
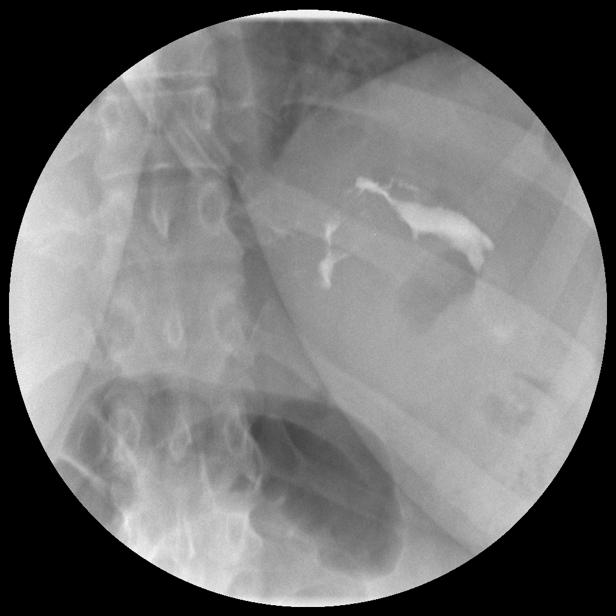
[im 22/22]
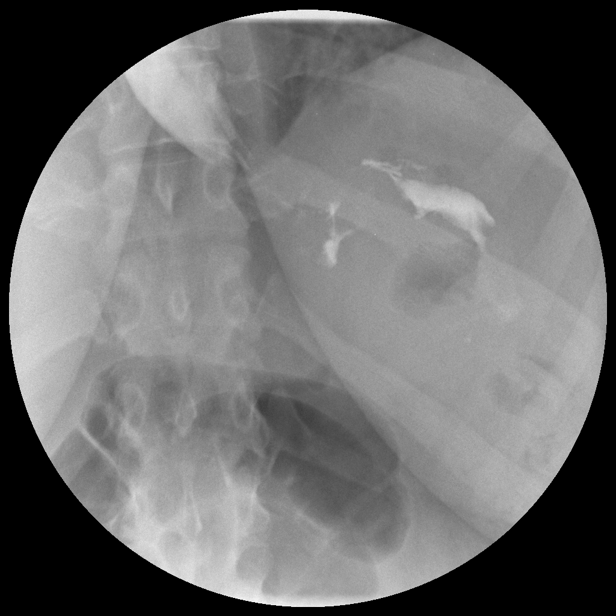

[Series 2: run · 3 of 19 slices shown (2 of 5)]
[im 1/19]
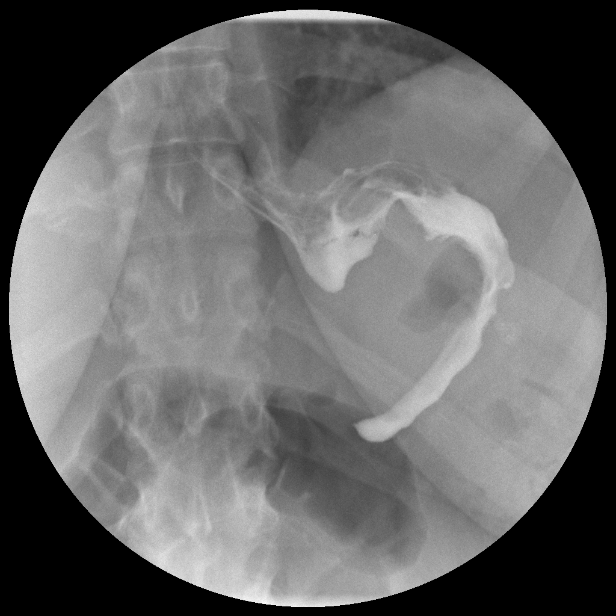
[im 13/19]
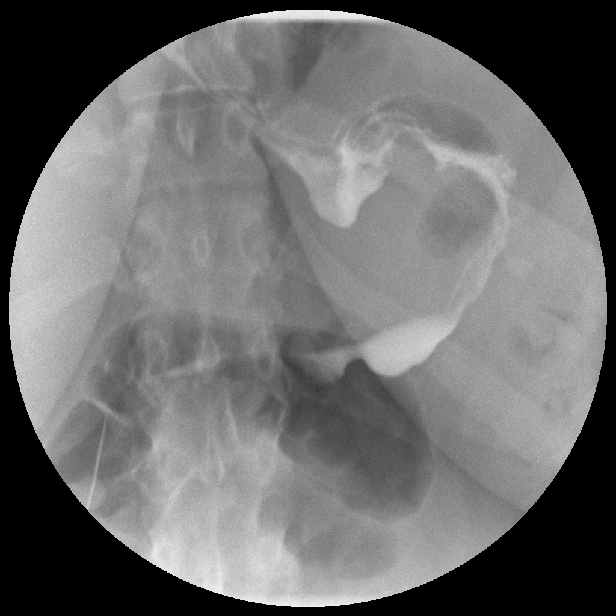
[im 19/19]
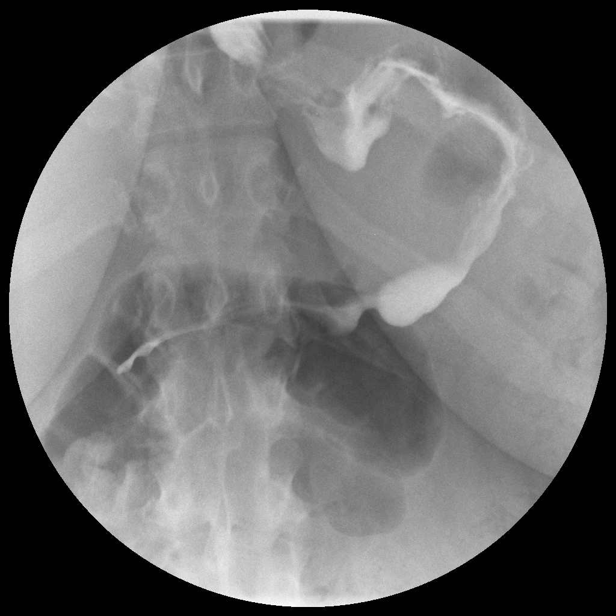

[Series 3: run · 4 of 25 slices shown (3 of 5)]
[im 5/25]
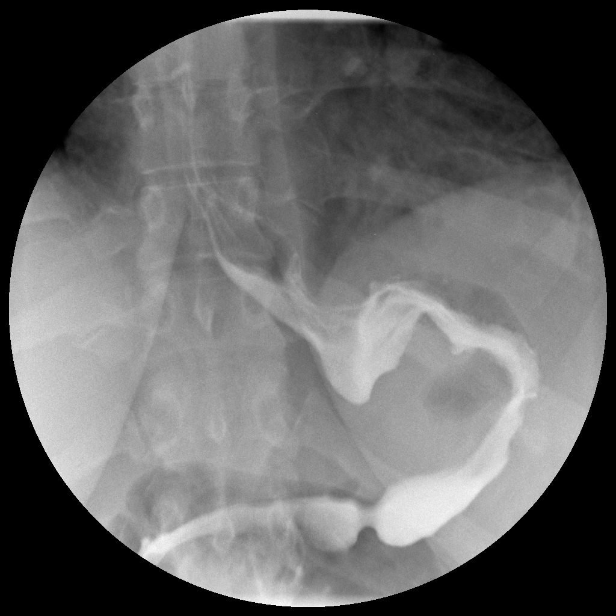
[im 13/25]
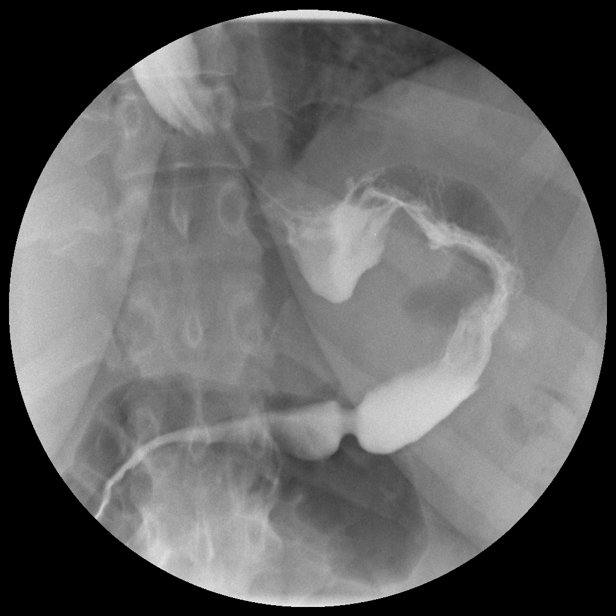
[im 17/25]
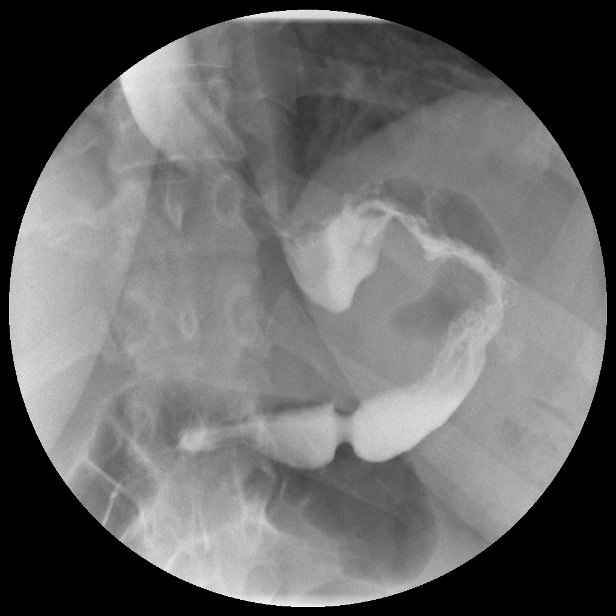
[im 25/25]
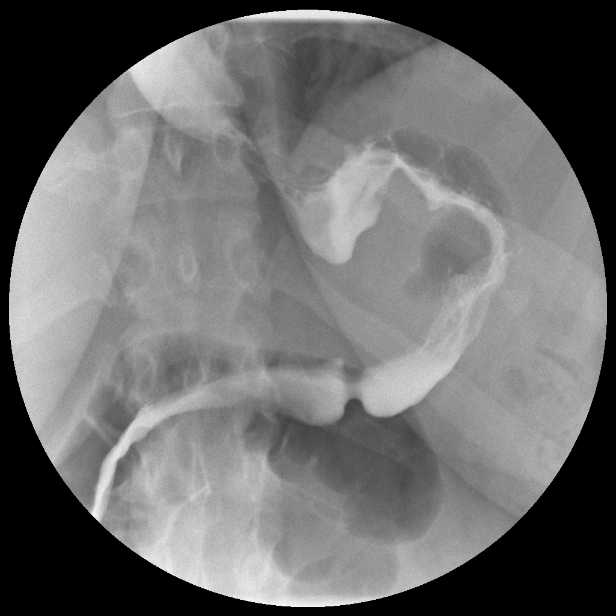

[Series 4: run · 1 of 3 slices shown (4 of 5)]
[im 1/3]
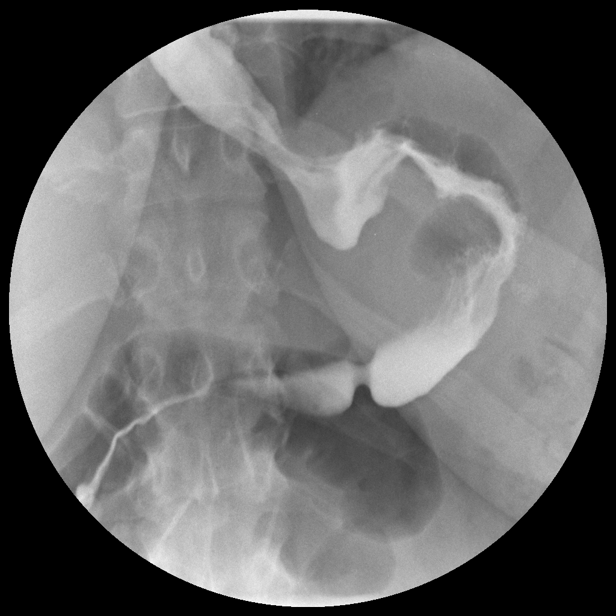

[Series 6: run · 3 of 17 slices shown (5 of 5)]
[im 1/17]
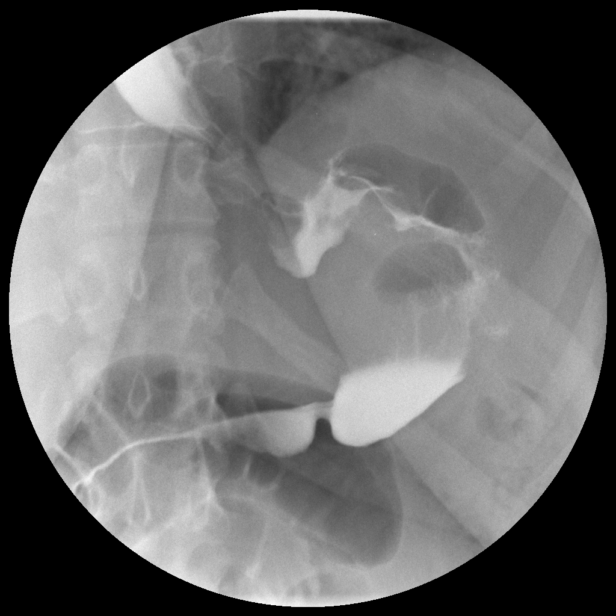
[im 9/17]
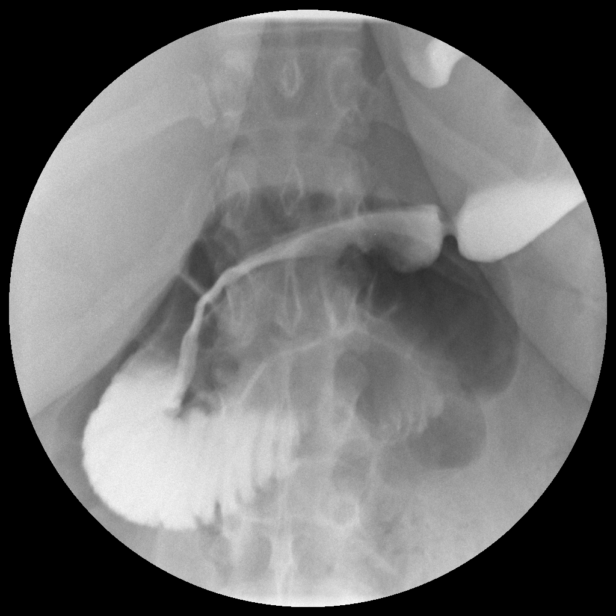
[im 13/17]
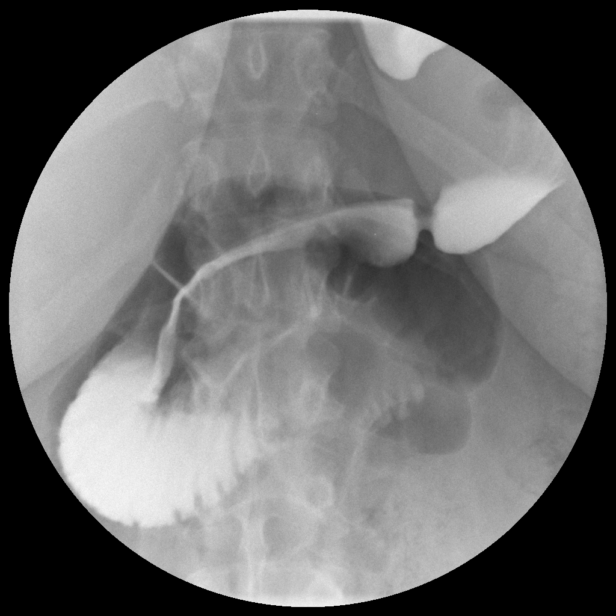

[Series 1001: view not recorded · 0.20mm/px · 1 of 1 slices shown]
[im 1/1]
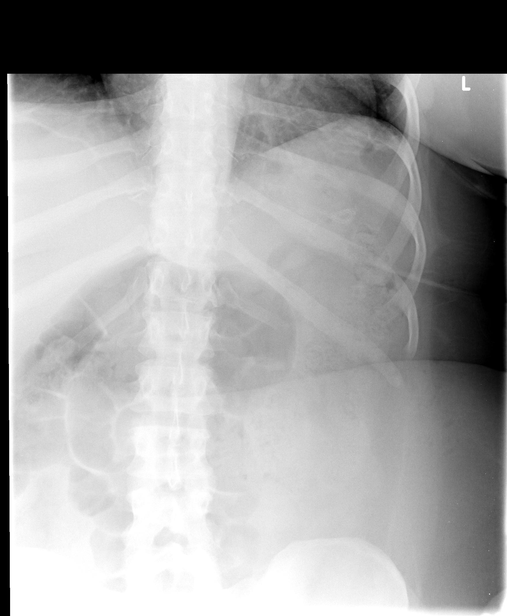

[15 of 24 positions shown; findings below may reference images not displayed]

FINDINGS: Postoperative appearance of the stomach compatible with sleeve
gastrectomy. There is no evidence for gastric outlet obstruction or
extravasation of contrast material to suggest leak. Emptying of the
stomach with opacification of the proximal small bowel loops
identified.
IMPRESSION: 1. Postoperative appearance of the stomach compatible with interval
sleeve gastrectomy.
2. No complications identified.

## 2016-04-09 ENCOUNTER — Encounter (HOSPITAL_COMMUNITY): Payer: Self-pay

## 2016-07-29 ENCOUNTER — Other Ambulatory Visit: Payer: Self-pay | Admitting: Obstetrics and Gynecology

## 2016-07-29 ENCOUNTER — Other Ambulatory Visit (HOSPITAL_COMMUNITY)
Admission: RE | Admit: 2016-07-29 | Discharge: 2016-07-29 | Disposition: A | Discharge status: Home / Self Care | Payer: 59 | Source: Ambulatory Visit | Attending: Obstetrics and Gynecology | Admitting: Obstetrics and Gynecology

## 2016-07-29 DIAGNOSIS — R87810 Cervical high risk human papillomavirus (HPV) DNA test positive: Secondary | ICD-10-CM | POA: Diagnosis present

## 2016-07-29 DIAGNOSIS — Z01419 Encounter for gynecological examination (general) (routine) without abnormal findings: Secondary | ICD-10-CM | POA: Diagnosis present

## 2016-07-29 DIAGNOSIS — Z1151 Encounter for screening for human papillomavirus (HPV): Secondary | ICD-10-CM | POA: Diagnosis not present

## 2016-07-29 DIAGNOSIS — Z113 Encounter for screening for infections with a predominantly sexual mode of transmission: Secondary | ICD-10-CM | POA: Insufficient documentation

## 2016-08-01 LAB — CYTOLOGY - PAP
CHLAMYDIA, DNA PROBE: NEGATIVE
Diagnosis: NEGATIVE
HPV: DETECTED — AB
Neisseria Gonorrhea: NEGATIVE

## 2017-02-03 ENCOUNTER — Encounter (HOSPITAL_COMMUNITY): Payer: Self-pay

## 2017-09-02 ENCOUNTER — Other Ambulatory Visit (HOSPITAL_COMMUNITY)
Admission: RE | Admit: 2017-09-02 | Discharge: 2017-09-02 | Disposition: A | Discharge status: Home / Self Care | Payer: 59 | Source: Ambulatory Visit | Attending: Obstetrics and Gynecology | Admitting: Obstetrics and Gynecology

## 2017-09-02 ENCOUNTER — Other Ambulatory Visit: Payer: Self-pay | Admitting: Obstetrics and Gynecology

## 2017-09-02 DIAGNOSIS — Z01419 Encounter for gynecological examination (general) (routine) without abnormal findings: Secondary | ICD-10-CM | POA: Insufficient documentation

## 2017-09-04 LAB — CYTOLOGY - PAP
Chlamydia: NEGATIVE
Diagnosis: UNDETERMINED — AB
HPV (WINDOPATH): DETECTED — AB
Neisseria Gonorrhea: NEGATIVE

## 2018-09-07 ENCOUNTER — Other Ambulatory Visit (HOSPITAL_COMMUNITY)
Admission: RE | Admit: 2018-09-07 | Discharge: 2018-09-07 | Disposition: A | Discharge status: Home / Self Care | Payer: 59 | Source: Ambulatory Visit | Attending: Obstetrics and Gynecology | Admitting: Obstetrics and Gynecology

## 2018-09-07 ENCOUNTER — Other Ambulatory Visit: Payer: Self-pay | Admitting: Obstetrics and Gynecology

## 2018-09-07 DIAGNOSIS — Z01419 Encounter for gynecological examination (general) (routine) without abnormal findings: Secondary | ICD-10-CM | POA: Insufficient documentation

## 2018-09-11 LAB — CYTOLOGY - PAP
Chlamydia: NEGATIVE
Diagnosis: NEGATIVE
HPV: NOT DETECTED
Neisseria Gonorrhea: NEGATIVE

## 2019-05-24 ENCOUNTER — Other Ambulatory Visit: Payer: Self-pay | Admitting: Obstetrics and Gynecology

## 2019-05-24 DIAGNOSIS — Z1231 Encounter for screening mammogram for malignant neoplasm of breast: Secondary | ICD-10-CM

## 2019-07-16 ENCOUNTER — Ambulatory Visit: Payer: 59

## 2019-07-19 ENCOUNTER — Ambulatory Visit: Payer: 59
